# Patient Record
Sex: Female | Born: 1945 | Race: White | Hispanic: No | Marital: Married | Smoking: Never smoker
Health system: Southern US, Community
[De-identification: ages and names within clinical notes are randomized; demographics above are authoritative.]

## PROBLEM LIST (undated history)

## (undated) DIAGNOSIS — H04123 Dry eye syndrome of bilateral lacrimal glands: Secondary | ICD-10-CM

## (undated) DIAGNOSIS — Z9889 Other specified postprocedural states: Secondary | ICD-10-CM

## (undated) DIAGNOSIS — R112 Nausea with vomiting, unspecified: Secondary | ICD-10-CM

## (undated) DIAGNOSIS — H269 Unspecified cataract: Secondary | ICD-10-CM

## (undated) DIAGNOSIS — S52501A Unspecified fracture of the lower end of right radius, initial encounter for closed fracture: Secondary | ICD-10-CM

## (undated) DIAGNOSIS — M810 Age-related osteoporosis without current pathological fracture: Secondary | ICD-10-CM

## (undated) DIAGNOSIS — K219 Gastro-esophageal reflux disease without esophagitis: Secondary | ICD-10-CM

## (undated) DIAGNOSIS — I10 Essential (primary) hypertension: Secondary | ICD-10-CM

## (undated) DIAGNOSIS — M1990 Unspecified osteoarthritis, unspecified site: Secondary | ICD-10-CM

## (undated) DIAGNOSIS — R51 Headache: Secondary | ICD-10-CM

## (undated) DIAGNOSIS — R519 Headache, unspecified: Secondary | ICD-10-CM

## (undated) DIAGNOSIS — Z5189 Encounter for other specified aftercare: Secondary | ICD-10-CM

## (undated) HISTORY — PX: FRACTURE SURGERY: SHX138

## (undated) HISTORY — PX: CATARACT EXTRACTION: SUR2

## (undated) HISTORY — PX: DILATION AND CURETTAGE OF UTERUS: SHX78

## (undated) HISTORY — PX: WRIST FRACTURE SURGERY: SHX121

## (undated) HISTORY — PX: HIP SURGERY: SHX245

## (undated) HISTORY — PX: JOINT REPLACEMENT: SHX530

## (undated) HISTORY — DX: Unspecified cataract: H26.9

## (undated) HISTORY — DX: Encounter for other specified aftercare: Z51.89

---

## 2015-10-28 ENCOUNTER — Emergency Department (HOSPITAL_BASED_OUTPATIENT_CLINIC_OR_DEPARTMENT_OTHER): Payer: Medicare Other

## 2015-10-28 ENCOUNTER — Encounter (HOSPITAL_BASED_OUTPATIENT_CLINIC_OR_DEPARTMENT_OTHER): Payer: Self-pay | Admitting: *Deleted

## 2015-10-28 ENCOUNTER — Emergency Department (HOSPITAL_BASED_OUTPATIENT_CLINIC_OR_DEPARTMENT_OTHER)
Admission: EM | Admit: 2015-10-28 | Discharge: 2015-10-28 | Disposition: A | Discharge status: Home / Self Care | Payer: Medicare Other | Attending: Emergency Medicine | Admitting: Emergency Medicine

## 2015-10-28 ENCOUNTER — Encounter (HOSPITAL_COMMUNITY): Payer: Self-pay | Admitting: *Deleted

## 2015-10-28 ENCOUNTER — Other Ambulatory Visit: Payer: Self-pay | Admitting: Orthopaedic Surgery

## 2015-10-28 DIAGNOSIS — I10 Essential (primary) hypertension: Secondary | ICD-10-CM | POA: Diagnosis not present

## 2015-10-28 DIAGNOSIS — R600 Localized edema: Secondary | ICD-10-CM | POA: Insufficient documentation

## 2015-10-28 DIAGNOSIS — S32592A Other specified fracture of left pubis, initial encounter for closed fracture: Secondary | ICD-10-CM

## 2015-10-28 DIAGNOSIS — Q71891 Other reduction defects of right upper limb: Secondary | ICD-10-CM | POA: Insufficient documentation

## 2015-10-28 DIAGNOSIS — S6991XA Unspecified injury of right wrist, hand and finger(s), initial encounter: Secondary | ICD-10-CM | POA: Diagnosis present

## 2015-10-28 DIAGNOSIS — Z79899 Other long term (current) drug therapy: Secondary | ICD-10-CM | POA: Diagnosis not present

## 2015-10-28 DIAGNOSIS — Y999 Unspecified external cause status: Secondary | ICD-10-CM | POA: Insufficient documentation

## 2015-10-28 DIAGNOSIS — W010XXA Fall on same level from slipping, tripping and stumbling without subsequent striking against object, initial encounter: Secondary | ICD-10-CM | POA: Insufficient documentation

## 2015-10-28 DIAGNOSIS — Y929 Unspecified place or not applicable: Secondary | ICD-10-CM | POA: Diagnosis not present

## 2015-10-28 DIAGNOSIS — Y9301 Activity, walking, marching and hiking: Secondary | ICD-10-CM | POA: Diagnosis not present

## 2015-10-28 DIAGNOSIS — T148XXA Other injury of unspecified body region, initial encounter: Secondary | ICD-10-CM

## 2015-10-28 DIAGNOSIS — S80211A Abrasion, right knee, initial encounter: Secondary | ICD-10-CM | POA: Insufficient documentation

## 2015-10-28 DIAGNOSIS — Q738 Other reduction defects of unspecified limb(s): Secondary | ICD-10-CM

## 2015-10-28 DIAGNOSIS — S62101A Fracture of unspecified carpal bone, right wrist, initial encounter for closed fracture: Secondary | ICD-10-CM

## 2015-10-28 DIAGNOSIS — W19XXXA Unspecified fall, initial encounter: Secondary | ICD-10-CM

## 2015-10-28 HISTORY — DX: Age-related osteoporosis without current pathological fracture: M81.0

## 2015-10-28 HISTORY — DX: Essential (primary) hypertension: I10

## 2015-10-28 LAB — CBC
HEMATOCRIT: 38.4 % (ref 36.0–46.0)
Hemoglobin: 12.9 g/dL (ref 12.0–15.0)
MCH: 29.9 pg (ref 26.0–34.0)
MCHC: 33.6 g/dL (ref 30.0–36.0)
MCV: 88.9 fL (ref 78.0–100.0)
Platelets: 143 10*3/uL — ABNORMAL LOW (ref 150–400)
RBC: 4.32 MIL/uL (ref 3.87–5.11)
RDW: 12.9 % (ref 11.5–15.5)
WBC: 9.2 10*3/uL (ref 4.0–10.5)

## 2015-10-28 LAB — BASIC METABOLIC PANEL
Anion gap: 5 (ref 5–15)
BUN: 12 mg/dL (ref 6–20)
CALCIUM: 8.4 mg/dL — AB (ref 8.9–10.3)
CO2: 26 mmol/L (ref 22–32)
CREATININE: 0.57 mg/dL (ref 0.44–1.00)
Chloride: 107 mmol/L (ref 101–111)
GFR calc Af Amer: >60 mL/min (ref >60)
GFR calc non Af Amer: >60 mL/min (ref >60)
GLUCOSE: 126 mg/dL — AB (ref 65–99)
Potassium: 3.4 mmol/L — ABNORMAL LOW (ref 3.5–5.1)
Sodium: 138 mmol/L (ref 135–145)

## 2015-10-28 LAB — PROTIME-INR
INR: 1.06
Prothrombin Time: 13.8 seconds (ref 11.4–15.2)

## 2015-10-28 MED ORDER — IBUPROFEN 200 MG PO TABS: 600.0000 mg | ORAL_TABLET | Freq: Four times a day (QID) | ORAL | 0 refills | Status: DC | PRN

## 2015-10-28 MED ORDER — PROPOFOL 10 MG/ML IV BOLUS
200.0000 mg | Freq: Once | INTRAVENOUS | Status: DC
  Filled 2015-10-28: qty 20

## 2015-10-28 MED ORDER — OXYCODONE-ACETAMINOPHEN 5-325 MG PO TABS
1.0000 | ORAL_TABLET | Freq: Once | ORAL | Status: AC
  Administered 2015-10-28: 1 via ORAL
  Filled 2015-10-28: qty 1

## 2015-10-28 MED ORDER — FENTANYL CITRATE (PF) 100 MCG/2ML IJ SOLN
INTRAMUSCULAR | Status: AC | PRN
  Administered 2015-10-28: 75 ug via INTRAVENOUS

## 2015-10-28 MED ORDER — PROPOFOL 10 MG/ML IV BOLUS
INTRAVENOUS | Status: AC | PRN
  Administered 2015-10-28: 5 mg via INTRAVENOUS

## 2015-10-28 MED ORDER — PROPOFOL 10 MG/ML IV BOLUS
INTRAVENOUS | Status: AC | PRN
  Administered 2015-10-28: 10 mg via INTRAVENOUS

## 2015-10-28 MED ORDER — ACETAMINOPHEN 500 MG PO TABS: 1000.0000 mg | ORAL_TABLET | Freq: Three times a day (TID) | ORAL | 0 refills | Status: AC

## 2015-10-28 MED ORDER — OXYCODONE-ACETAMINOPHEN 5-325 MG PO TABS: 1.0000 | ORAL_TABLET | Freq: Three times a day (TID) | ORAL | 0 refills | Status: DC | PRN

## 2015-10-28 MED ORDER — NALOXONE HCL 2 MG/2ML IJ SOSY
PREFILLED_SYRINGE | INTRAMUSCULAR | Status: AC
  Filled 2015-10-28: qty 2

## 2015-10-28 MED ORDER — FENTANYL CITRATE (PF) 100 MCG/2ML IJ SOLN
50.0000 ug | Freq: Once | INTRAMUSCULAR | Status: AC
  Administered 2015-10-28: 50 ug via INTRAVENOUS
  Filled 2015-10-28: qty 2

## 2015-10-28 MED ORDER — FENTANYL CITRATE (PF) 100 MCG/2ML IJ SOLN
INTRAMUSCULAR | Status: AC
  Filled 2015-10-28: qty 2

## 2015-10-28 MED FILL — OXYCODONE/APAP 5-325: 5-325 | 5 days supply | Qty: 15 | Fill #0

## 2015-10-28 MED FILL — MAPAP 500 MG CAPLET: 500 | 17 days supply | Qty: 100 | Fill #0

## 2015-10-28 NOTE — ED Notes (Signed)
Pt called out to use restroom, was able to stand and pivot to bedside toilet. Was also able to stand and pivot to wheelchair to exit dept. VSS upon departure.

## 2015-10-28 NOTE — ED Notes (Signed)
Spoke to Dr. Eudelia Bunch about pt status. States to encourage PO fluids and watch pt for improvement. Pt provided with drink and on automatic cycling vitals.

## 2015-10-28 NOTE — ED Notes (Signed)
md at bedside to update pt on plan of care. Pt no longer npo, will follow up in office tomorrow. Pt given sprite, husband given coffee per request.

## 2015-10-28 NOTE — Progress Notes (Signed)
Pt denies SOB, chest pain, and being under the care of a cardiologist. Pt denies having a stress test, echo and cardiac cath. Pt denies having a chest x ray within the last year. Pt made aware to stop taking Aspirin, fish oil, vitamins and herbal medications. Do not take any NSAIDs ie: Ibuprofen, Advil, Naproxen, BC and Goody Powder or any medication containing Aspirin. Rica Mast, NP, Anesthesia, reviewed pt EKG; no new orders given.

## 2015-10-28 NOTE — ED Provider Notes (Signed)
[facility] Note   CSN: [id number] Arrival date & time: 10/28/15  2952  First Provider Contact:  First MD Initiated Contact with Patient 10/28/15 [phone]        History   Chief Complaint Chief Complaint  Patient presents with  . Fall    HPI Renee Riley is a 70 y.o. female.  The history is provided by the patient.  Fall  This is a new problem. The current episode started 1 to 2 hours ago (while waling the dog this am). The problem occurs constantly. The problem has not changed since onset.Pertinent negatives include no chest pain, no abdominal pain, no headaches and no shortness of breath. The symptoms are aggravated by walking (movement). The symptoms are relieved by rest. She has tried nothing for the symptoms. The treatment provided mild relief.   She also reports previous left hip fracture with internal fixation in place at this time. She reports that the left hip is sore however not tender to palpation. Patient does have a history of osteoporosis  Past Medical History:  Diagnosis Date  . Hypertension   . Osteoporosis     There are no active problems to display for this patient.   History reviewed. No pertinent surgical history.  OB History    No data available       Home Medications    Prior to Admission medications   Medication Sig Start Date End Date Taking? Authorizing Provider  amlodipine-atorvastatin (CADUET) 10-10 MG tablet Take 1 tablet by mouth daily.   Yes Historical Provider, MD  acetaminophen (TYLENOL) 500 MG tablet Take 2 tablets (1,000 mg total) by mouth every 8 (eight) hours. Please do not exceed 4000 mg of acetaminophen in a 24-hour period. Please be advised that the other prescription medicine also contains acetaminophen. 10/28/15 10/30/15  Nira Conn, MD  ibuprofen (ADVIL) 200 MG tablet Take 3 tablets (600 mg total) by mouth every 6 (six) hours as needed for moderate pain. 10/28/15   Nira Conn, MD    oxyCODONE-acetaminophen (PERCOCET/ROXICET) 5-325 MG tablet Take 1 tablet by mouth every 8 (eight) hours as needed for severe pain (If pain is not controlled with Tylenol. Do not exceed 4000 mg of acetaminophen in a 24-hour period this medicine also contains acetaminophen so care should be taken.). 10/28/15   Nira Conn, MD    Family History History reviewed. No pertinent family history.  Social History Social History  Substance Use Topics  . Smoking status: Never Smoker  . Smokeless tobacco: Never Used  . Alcohol use Not on file     Allergies   Review of patient's allergies indicates no known allergies.   Review of Systems Review of Systems  Constitutional: Negative for fatigue.  HENT: Negative for facial swelling and voice change.   Eyes: Negative for pain and visual disturbance.  Respiratory: Negative for cough and shortness of breath.   Cardiovascular: Negative for chest pain and palpitations.  Gastrointestinal: Negative for abdominal pain and vomiting.  Genitourinary: Negative for difficulty urinating.  Musculoskeletal: Positive for gait problem and joint swelling. Negative for arthralgias, back pain and neck pain.  Skin: Positive for wound.  Neurological: Negative for seizures, syncope and headaches.  All other systems reviewed and are negative.    Physical Exam Updated Vital Signs BP 108/79   Pulse 73   Temp 97.9 F (36.6 C) (Oral)   Resp 16   Ht  (1.676 m)   Wt 130 lb (59 kg)  SpO2 98%   BMI 20.98 kg/m   Physical Exam  Constitutional: She is oriented to person, place, and time. She appears well-developed and well-nourished. No distress.  HENT:  Head: Normocephalic and atraumatic.  Right Ear: External ear normal.  Left Ear: External ear normal.  Nose: Nose normal.  Eyes: Conjunctivae and EOM are normal. Pupils are equal, round, and reactive to light. Right eye exhibits no discharge. Left eye exhibits no discharge. No scleral icterus.   Neck: Normal range of motion. Neck supple.  Cardiovascular: Normal rate, regular rhythm and normal heart sounds.  Exam reveals no gallop and no friction rub.   No murmur heard. Pulses:      Radial pulses are 2+ on the right side, and 2+ on the left side.       Dorsalis pedis pulses are 2+ on the right side, and 2+ on the left side.  Pulmonary/Chest: Effort normal and breath sounds normal. No stridor. No respiratory distress. She has no wheezes.  Abdominal: Soft. She exhibits no distension. There is no tenderness.  Musculoskeletal: She exhibits no edema or tenderness.       Right wrist: She exhibits bony tenderness, swelling and deformity.       Left hip: She exhibits normal range of motion (however painful), no bony tenderness, no deformity and no laceration.       Right knee: She exhibits no swelling, no effusion, no ecchymosis and no deformity.       Cervical back: She exhibits no tenderness and no bony tenderness.       Thoracic back: She exhibits no bony tenderness.       Lumbar back: She exhibits no bony tenderness.  Clavicles stable. Chest stable to AP/Lat compression Pelvis stable to Lat compression    Neurological: She is alert and oriented to person, place, and time.  Moving all extremities  Skin: Skin is warm and dry. Abrasion (right knee) noted. No rash noted. She is not diaphoretic. No erythema.  Psychiatric: She has a normal mood and affect.  Nursing note and vitals reviewed.    ED Treatments / Results  Labs (all labs ordered are listed, but only abnormal results are displayed) Labs Reviewed  CBC - Abnormal; Notable for the following:       Result Value   Platelets 143 (*)    All other components within normal limits  BASIC METABOLIC PANEL - Abnormal; Notable for the following:    Potassium 3.4 (*)    Glucose, Bld 126 (*)    Calcium 8.4 (*)    All other components within normal limits  PROTIME-INR    EKG  EKG Interpretation  Date/Time:  Tuesday October 28 2015 10:18:37 EDT Ventricular Rate:  71 PR Interval:    QRS Duration: 89 QT Interval:  410 QTC Calculation: 446 R Axis:   -7 Text Interpretation:  Sinus rhythm Low voltage, precordial leads Nonspecific T wave abnormality No old tracing to compare Confirmed by [facility] MD, PEDRO ([id number]) on 10/28/2015 10:50:58 AM       Radiology Dg Lumbar Spine 2-3 Views  Result Date: 10/28/2015 CLINICAL DATA:  Fall.  Back pain EXAM: LUMBAR SPINE - 2-3 VIEW COMPARISON:  None. FINDINGS: S1 is partially lumbarized. Slight anterior slip L3-4 with associated disc degeneration and spurring right greater than left. Marked disc space narrowing and spurring L5-S1. The facet degeneration L5-S1 Negative for fracture. Right hip replacement. Pinning of left hip. Fracture of the left superior pubis appears acute IMPRESSION: Negative for lumbar  spine fracture Fracture left superior pubis appears acute. Electronically Signed   By: Marlan Palau M.D.   On: 10/28/2015 08:27   Dg Wrist 2 Views Right  Result Date: 10/28/2015 CLINICAL DATA:  Status post reduction EXAM: RIGHT WRIST - 2 VIEW COMPARISON:  Films from earlier in the same day FINDINGS: Previously seen distal radial fracture has been reduced somewhat. The degree of angulation has improved although persistent impaction of the fracture fragments is noted. IMPRESSION: Improved angulation following reduction and casting. Electronically Signed   By: Alcide Clever M.D.   On: 10/28/2015 09:46   Dg Wrist Complete Right  Result Date: 10/28/2015 CLINICAL DATA:  Pt c/o right wrist pain and tenderness, right knee pain and tenderness-right knee laceration to lateral aspect, left hip pain and tenderness, left sided lower back pain all since falling this morning after getting a leash tangled around her ankles EXAM: RIGHT WRIST - COMPLETE 3+ VIEW COMPARISON:  None. FINDINGS: There is a comminuted fracture of the distal right radius, extending to the articular surface and affecting radial ulnar  joint. Ulnar plus variance noted. There is dorsal tilting dorsal displacement by 1/2 shaft width. Intercarpal alignment appears normal. Distal ulna is intact. Degenerative changes are identified at the first carpometacarpal joint. IMPRESSION: Comminuted fracture of the distal radius affecting the radiocarpal joint and radioulnar joint. Ulnar plus variance. Electronically Signed   By: Norva Pavlov M.D.   On: 10/28/2015 08:26   Dg Knee Complete 4 Views Right  Result Date: 10/28/2015 CLINICAL DATA:  Pain and tenderness following fall. Laceration laterally EXAM: RIGHT KNEE - COMPLETE 4+ VIEW COMPARISON:  None. FINDINGS: Frontal, lateral, and bilateral oblique views were obtained. There is no apparent fracture or dislocation. No joint effusion. No soft tissue air evident. There is joint space narrowing medially and in the patellofemoral joint regions. No erosive change. IMPRESSION: Areas of osteoarthritic change. No fracture or dislocation. No joint effusion or soft tissue air. Electronically Signed   By: Bretta Bang III M.D.   On: 10/28/2015 08:25   Dg Hip Unilat With Pelvis Min 4 Views Left  Result Date: 10/28/2015 CLINICAL DATA:  Fall after a leash got tangled around the patient's ankles. Left hip pain and tenderness. Left-sided low back pain. Initial encounter. EXAM: DG HIP (WITH OR WITHOUT PELVIS) 4+V LEFT COMPARISON:  None. FINDINGS: There is evidence of an old proximal left femur fracture with 3 cannulated lag screws terminating in the femoral head. The left hip is located without an acute fracture identified. A small ossicle adjacent to the superolateral left acetabulum may be related to remote trauma or be developmental. Limited evaluation of a right total hip arthroplasty is unremarkable. IMPRESSION: Posttraumatic and postoperative changes without evidence of acute osseous abnormality. Electronically Signed   By: Sebastian Ache M.D.   On: 10/28/2015 08:27    Procedures Procedures (including  critical care time)  Procedural sedation Performed by: Amadeo Garnet Cardama Consent: Verbal consent obtained. Risks and benefits: risks, benefits and alternatives were discussed Required items: required blood products, implants, devices, and/or special equipment available Patient identity confirmed: arm band and provided demographic data Time out: Immediately prior to procedure a "time out" was called to verify the correct patient, procedure, equipment, support staff and site/side marked as required.  Sedation type: moderate (conscious) sedation NPO time confirmed and considedered  Sedatives: propofol (50mg  )total  Physician Time at Bedside: 0830  Vitals: Vital signs were monitored during sedation. Cardiac Monitor, pulse oximeter Patient tolerance: Patient tolerated the procedure  well with no immediate complications. Comments: Pt with uneventful recovered. Returned to pre-procedural sedation baseline  Reduction of dislocation Performed by: Amadeo Garnet Cardama Consent: Verbal consent obtained. Risks and benefits: risks, benefits and alternatives were discussed Consent given by: patient Required items: required blood products, implants, devices, and special equipment available Time out: Immediately prior to procedure a "time out" was called to verify the correct patient, procedure, equipment, support staff and site/side marked as required.  Patient sedated: yes, as above  Vitals: Vital signs were monitored during sedation. Patient tolerance: Patient tolerated the procedure well with no immediate complications. Joint: right wrist Reduction technique: manipulation   Medications Ordered in ED Medications  fentaNYL (SUBLIMAZE) injection 50 mcg (50 mcg Intravenous Given 10/28/15 0759)  propofol (DIPRIVAN) 10 mg/mL bolus/IV push ( Intravenous Stopped 10/28/15 1153)  propofol (DIPRIVAN) 10 mg/mL bolus/IV push ( Intravenous Stopped 10/28/15 1153)  propofol (DIPRIVAN) 10 mg/mL bolus/IV  push ( Intravenous Stopped 10/28/15 1154)  naloxone (NARCAN) 2 MG/2ML injection (  Return to [facility] 10/28/15 1154)  propofol (DIPRIVAN) 10 mg/mL bolus/IV push ( Intravenous Stopped 10/28/15 1153)  propofol (DIPRIVAN) 10 mg/mL bolus/IV push ( Intravenous Stopped 10/28/15 1153)  propofol (DIPRIVAN) 10 mg/mL bolus/IV push ( Intravenous Stopped 10/28/15 1153)  fentaNYL (SUBLIMAZE) injection (75 mcg Intravenous Given 10/28/15 0900)  oxyCODONE-acetaminophen (PERCOCET/ROXICET) 5-325 MG per tablet 1 tablet (1 tablet Oral Given 10/28/15 1018)     Initial Impression / Assessment and Plan / ED Course  I have reviewed the triage vital signs and the nursing notes.  Pertinent labs & imaging results that were available during my care of the patient were reviewed by me and considered in my medical decision making (see chart for details).  Clinical Course    Obvious right wrist deformity that is neurovascularly intact. Will obtain plain film to better characterize the fracture. Will also obtain plain films of the lower back left hip and right knee to assess for any other injuries.   Patient provided with fentanyl for pain. Discussed procedural sedation with the patient For reduction of her right distal radius fracture. Patient agreed with the plan.   Plain film of the right wrist with Colle's as fracture with lateral ulnar joint displacement. Exam of the hip also notable for likely acute superior ramus fracture. We'll consult orthopedic surgery.  Procedural sedation and reduction as above. Sugar tong splint placed.  Patient will need to follow-up with orthopedic surgery tomorrow For repair of the right distal radius.. She was provided with Dr. Warren Danes office number in order to call in case she was not contacted later on today for surgical planning.  Patient safe for discharge with strict return precautions. Patient follow-up with orthopedic surgery as above.    Final Clinical Impressions(s) / ED Diagnoses   Final  diagnoses:  Reduction defect of extremity  Fall, initial encounter  Wrist fracture, right, closed, initial encounter  Inferior pubic ramus fracture, left, closed, initial encounter (HCC)  Abrasion    New Prescriptions Discharge Medication List as of 10/28/2015 10:56 AM    START taking these medications   Details  acetaminophen (TYLENOL) 500 MG tablet Take 2 tablets (1,000 mg total) by mouth every 8 (eight) hours. Please do not exceed 4000 mg of acetaminophen in a 24-hour period. Please be advised that the other prescription medicine also contains acetaminophen., Starting Tue 10/28/2015, Until Thu 8/3/2 017, OTC    ibuprofen (ADVIL) 200 MG tablet Take 3 tablets (600 mg total) by mouth every 6 (six) hours as needed  for moderate pain., Starting Tue 10/28/2015, Print    oxyCODONE-acetaminophen (PERCOCET/ROXICET) 5-325 MG tablet Take 1 tablet by mouth every 8 (eight) hours as needed for severe pain (If pain is not controlled with Tylenol. Do not exceed 4000 mg of acetaminophen in a 24-hour period this medicine also contains acetaminophen so care should be taken.)., Starting Tue  10/28/2015, Print         Nira Conn, MD 10/28/15 [phone]

## 2015-10-28 NOTE — Progress Notes (Signed)
Pt verbalized understanding of all pre-op instructions. 

## 2015-10-28 NOTE — Discharge Instructions (Signed)
The orthopedic surgeon recommended weightbearing as tolerated for your pelvic fracture which is nonoperable at this time. They will see you tomorrow for surgery of the wrist and can address any questions at that time as well too.

## 2015-10-28 NOTE — ED Triage Notes (Signed)
Pt reports walking her dogs this morning and tripped and fell. Pt reports right wrist pain, deformity noted, wrist splinted per protocol. Pt reports left hip and back pain, reports history of hip fx and osteoprosis. Denies head injury or loc

## 2015-10-28 NOTE — ED Notes (Signed)
Pt stated she felt like she may pass out when standing to get to wheelchair to leave. Upon assessment, pt BP was 86 systolic sitting on edge of the bed. Pt is alert, interactive, and in NAD but does not feel well. Instructed to stay in the room on the bed until she feels better.

## 2015-10-28 NOTE — ED Notes (Signed)
Pt placed on 2lpm [place] with capnography.

## 2015-10-29 ENCOUNTER — Encounter (HOSPITAL_COMMUNITY)
Admission: RE | Disposition: A | Discharge status: Home / Self Care | Payer: Self-pay | Source: Ambulatory Visit | Attending: Orthopaedic Surgery

## 2015-10-29 ENCOUNTER — Ambulatory Visit (HOSPITAL_COMMUNITY): Payer: Medicare Other | Admitting: Anesthesiology

## 2015-10-29 ENCOUNTER — Encounter (HOSPITAL_COMMUNITY): Payer: Self-pay | Admitting: *Deleted

## 2015-10-29 ENCOUNTER — Ambulatory Visit (HOSPITAL_COMMUNITY): Payer: Medicare Other

## 2015-10-29 ENCOUNTER — Ambulatory Visit (HOSPITAL_COMMUNITY)
Admission: RE | Admit: 2015-10-29 | Discharge: 2015-10-29 | Disposition: A | Discharge status: Home / Self Care | Payer: Medicare Other | Source: Ambulatory Visit | Attending: Orthopaedic Surgery | Admitting: Orthopaedic Surgery

## 2015-10-29 DIAGNOSIS — X58XXXA Exposure to other specified factors, initial encounter: Secondary | ICD-10-CM | POA: Insufficient documentation

## 2015-10-29 DIAGNOSIS — I10 Essential (primary) hypertension: Secondary | ICD-10-CM | POA: Insufficient documentation

## 2015-10-29 DIAGNOSIS — Z7982 Long term (current) use of aspirin: Secondary | ICD-10-CM | POA: Insufficient documentation

## 2015-10-29 DIAGNOSIS — S52501A Unspecified fracture of the lower end of right radius, initial encounter for closed fracture: Secondary | ICD-10-CM | POA: Diagnosis present

## 2015-10-29 DIAGNOSIS — K219 Gastro-esophageal reflux disease without esophagitis: Secondary | ICD-10-CM | POA: Insufficient documentation

## 2015-10-29 DIAGNOSIS — Z419 Encounter for procedure for purposes other than remedying health state, unspecified: Secondary | ICD-10-CM

## 2015-10-29 HISTORY — DX: Nausea with vomiting, unspecified: R11.2

## 2015-10-29 HISTORY — PX: OPEN REDUCTION INTERNAL FIXATION (ORIF) DISTAL RADIAL FRACTURE: SHX5989

## 2015-10-29 HISTORY — DX: Headache: R51

## 2015-10-29 HISTORY — DX: Dry eye syndrome of bilateral lacrimal glands: H04.123

## 2015-10-29 HISTORY — DX: Headache, unspecified: R51.9

## 2015-10-29 HISTORY — DX: Other specified postprocedural states: Z98.890

## 2015-10-29 HISTORY — DX: Gastro-esophageal reflux disease without esophagitis: K21.9

## 2015-10-29 HISTORY — DX: Unspecified osteoarthritis, unspecified site: M19.90

## 2015-10-29 HISTORY — DX: Unspecified fracture of the lower end of right radius, initial encounter for closed fracture: S52.501A

## 2015-10-29 SURGERY — OPEN REDUCTION INTERNAL FIXATION (ORIF) DISTAL RADIUS FRACTURE
Anesthesia: General | Site: Wrist | Laterality: Right

## 2015-10-29 MED ORDER — FENTANYL CITRATE (PF) 100 MCG/2ML IJ SOLN
INTRAMUSCULAR | Status: AC
  Administered 2015-10-29: 100 ug via INTRAVENOUS
  Filled 2015-10-29: qty 2

## 2015-10-29 MED ORDER — FENTANYL CITRATE (PF) 100 MCG/2ML IJ SOLN
50.0000 ug | INTRAMUSCULAR | Status: DC | PRN
  Administered 2015-10-29: 100 ug via INTRAVENOUS

## 2015-10-29 MED ORDER — MIDAZOLAM HCL 5 MG/5ML IJ SOLN
INTRAMUSCULAR | Status: DC | PRN
  Administered 2015-10-29: 2 mg via INTRAVENOUS

## 2015-10-29 MED ORDER — LACTATED RINGERS IV SOLN
INTRAVENOUS | Status: DC
  Administered 2015-10-29 (×3): via INTRAVENOUS

## 2015-10-29 MED ORDER — MIDAZOLAM HCL 2 MG/2ML IJ SOLN
1.0000 mg | INTRAMUSCULAR | Status: DC | PRN
  Administered 2015-10-29: 2 mg via INTRAVENOUS

## 2015-10-29 MED ORDER — MIDAZOLAM HCL 2 MG/2ML IJ SOLN: 0.5000 mg | Freq: Once | INTRAMUSCULAR | Status: DC | PRN

## 2015-10-29 MED ORDER — MEPERIDINE HCL 25 MG/ML IJ SOLN
INTRAMUSCULAR | Status: AC
  Administered 2015-10-29: 6.25 mg via INTRAVENOUS
  Filled 2015-10-29: qty 1

## 2015-10-29 MED ORDER — HYDROMORPHONE HCL 1 MG/ML IJ SOLN
0.2500 mg | INTRAMUSCULAR | Status: DC | PRN
  Administered 2015-10-29: 0.5 mg via INTRAVENOUS

## 2015-10-29 MED ORDER — PROMETHAZINE HCL 25 MG/ML IJ SOLN: 6.2500 mg | INTRAMUSCULAR | Status: DC | PRN

## 2015-10-29 MED ORDER — PROPOFOL 10 MG/ML IV BOLUS
INTRAVENOUS | Status: AC
  Filled 2015-10-29: qty 40

## 2015-10-29 MED ORDER — SENNOSIDES-DOCUSATE SODIUM 8.6-50 MG PO TABS: 1.0000 | ORAL_TABLET | Freq: Every evening | ORAL | 1 refills | Status: DC | PRN

## 2015-10-29 MED ORDER — LIDOCAINE HCL (CARDIAC) 20 MG/ML IV SOLN
INTRAVENOUS | Status: DC | PRN
  Administered 2015-10-29: 20 mg via INTRAVENOUS

## 2015-10-29 MED ORDER — PROPOFOL 10 MG/ML IV BOLUS
INTRAVENOUS | Status: DC | PRN
  Administered 2015-10-29: 150 mg via INTRAVENOUS

## 2015-10-29 MED ORDER — ONDANSETRON HCL 4 MG/2ML IJ SOLN
INTRAMUSCULAR | Status: DC | PRN
  Administered 2015-10-29: 4 mg via INTRAVENOUS

## 2015-10-29 MED ORDER — MIDAZOLAM HCL 2 MG/2ML IJ SOLN
INTRAMUSCULAR | Status: AC
  Filled 2015-10-29: qty 2

## 2015-10-29 MED ORDER — MIDAZOLAM HCL 2 MG/2ML IJ SOLN
INTRAMUSCULAR | Status: AC
  Administered 2015-10-29: 2 mg via INTRAVENOUS
  Filled 2015-10-29: qty 2

## 2015-10-29 MED ORDER — HYDROMORPHONE HCL 1 MG/ML IJ SOLN
INTRAMUSCULAR | Status: AC
  Administered 2015-10-29: 0.5 mg via INTRAVENOUS
  Filled 2015-10-29: qty 1

## 2015-10-29 MED ORDER — CEFAZOLIN SODIUM-DEXTROSE 2-4 GM/100ML-% IV SOLN
2.0000 g | INTRAVENOUS | Status: AC
Start: 2015-10-29 — End: 2015-10-29
  Administered 2015-10-29: 2 g via INTRAVENOUS
  Filled 2015-10-29: qty 100

## 2015-10-29 MED ORDER — VITAMIN C 500 MG PO CHEW: 500.0000 mg | CHEWABLE_TABLET | Freq: Two times a day (BID) | ORAL | 0 refills | Status: AC

## 2015-10-29 MED ORDER — FENTANYL CITRATE (PF) 100 MCG/2ML IJ SOLN
INTRAMUSCULAR | Status: DC | PRN
  Administered 2015-10-29: 50 ug via INTRAVENOUS

## 2015-10-29 MED ORDER — OXYCODONE HCL ER 10 MG PO T12A: 10.0000 mg | EXTENDED_RELEASE_TABLET | Freq: Two times a day (BID) | ORAL | 0 refills | Status: DC

## 2015-10-29 MED ORDER — MEPERIDINE HCL 25 MG/ML IJ SOLN
6.2500 mg | INTRAMUSCULAR | Status: DC | PRN
  Administered 2015-10-29: 6.25 mg via INTRAVENOUS

## 2015-10-29 MED ORDER — ONDANSETRON HCL 4 MG PO TABS: 4.0000 mg | ORAL_TABLET | Freq: Three times a day (TID) | ORAL | 0 refills | Status: DC | PRN

## 2015-10-29 MED ORDER — 0.9 % SODIUM CHLORIDE (POUR BTL) OPTIME
TOPICAL | Status: DC | PRN
  Administered 2015-10-29: 1000 mL

## 2015-10-29 MED ORDER — FENTANYL CITRATE (PF) 250 MCG/5ML IJ SOLN
INTRAMUSCULAR | Status: AC
  Filled 2015-10-29: qty 5

## 2015-10-29 MED ORDER — OXYCODONE HCL 5 MG PO TABS: 5.0000 mg | ORAL_TABLET | ORAL | 0 refills | Status: DC | PRN

## 2015-10-29 SURGICAL SUPPLY — 62 items
BANDAGE ELASTIC 3 VELCRO ST LF (GAUZE/BANDAGES/DRESSINGS) ×2 IMPLANT
BANDAGE ELASTIC 4 VELCRO ST LF (GAUZE/BANDAGES/DRESSINGS) ×2 IMPLANT
BIT DRILL 2.2 SS TIBIAL (BIT) ×2 IMPLANT
BLADE SURG 10 STRL SS (BLADE) ×2 IMPLANT
BLADE SURG 15 STRL LF DISP TIS (BLADE) ×1 IMPLANT
BLADE SURG 15 STRL SS (BLADE) ×1
BLADE SURG ROTATE 9660 (MISCELLANEOUS) IMPLANT
BNDG ESMARK 4X9 LF (GAUZE/BANDAGES/DRESSINGS) ×2 IMPLANT
BNDG GAUZE ELAST 4 BULKY (GAUZE/BANDAGES/DRESSINGS) ×2 IMPLANT
CORDS BIPOLAR (ELECTRODE) ×2 IMPLANT
COVER SURGICAL LIGHT HANDLE (MISCELLANEOUS) ×2 IMPLANT
CUFF TOURNIQUET SINGLE 18IN (TOURNIQUET CUFF) ×2 IMPLANT
CUFF TOURNIQUET SINGLE 24IN (TOURNIQUET CUFF) IMPLANT
DRAPE C-ARM 42X72 X-RAY (DRAPES) IMPLANT
DRAPE U-SHAPE 47X51 STRL (DRAPES) ×2 IMPLANT
ELECT CAUTERY BLADE 6.4 (BLADE) ×2 IMPLANT
GAUZE SPONGE 4X4 12PLY STRL (GAUZE/BANDAGES/DRESSINGS) ×2 IMPLANT
GAUZE XEROFORM 1X8 LF (GAUZE/BANDAGES/DRESSINGS) ×2 IMPLANT
GLOVE SKINSENSE NS SZ7.5 (GLOVE) ×1
GLOVE SKINSENSE STRL SZ7.5 (GLOVE) ×1 IMPLANT
GOWN STRL REIN XL XLG (GOWN DISPOSABLE) ×2 IMPLANT
K-WIRE 1.6 (WIRE) ×3
K-WIRE FX5X1.6XNS BN SS (WIRE) ×3
KIT BASIN OR (CUSTOM PROCEDURE TRAY) ×2 IMPLANT
KIT ROOM TURNOVER OR (KITS) ×2 IMPLANT
KWIRE FX5X1.6XNS BN SS (WIRE) ×3 IMPLANT
NEEDLE 22X1 1/2 (OR ONLY) (NEEDLE) IMPLANT
NS IRRIG 1000ML POUR BTL (IV SOLUTION) ×2 IMPLANT
PACK ORTHO EXTREMITY (CUSTOM PROCEDURE TRAY) ×2 IMPLANT
PAD ARMBOARD 7.5X6 YLW CONV (MISCELLANEOUS) ×4 IMPLANT
PAD CAST 4YDX4 CTTN HI CHSV (CAST SUPPLIES) ×1 IMPLANT
PADDING CAST COTTON 4X4 STRL (CAST SUPPLIES) ×1
PLATE DVR CROSSLOCK STD RT (Plate) ×2 IMPLANT
PUTTY DBM STAGRAFT PLUS 2CC (Putty) ×2 IMPLANT
SCREW LOCK 12X2.7X 3 LD (Screw) ×2 IMPLANT
SCREW LOCK 14X2.7X 3 LD TPR (Screw) ×1 IMPLANT
SCREW LOCK 16X2.7X 3 LD TPR (Screw) ×1 IMPLANT
SCREW LOCK 18X2.7X 3 LD TPR (Screw) ×3 IMPLANT
SCREW LOCK 20X2.7X 3 LD TPR (Screw) ×2 IMPLANT
SCREW LOCK 22X2.7X 3 LD TPR (Screw) ×1 IMPLANT
SCREW LOCKING 2.7X12MM (Screw) ×2 IMPLANT
SCREW LOCKING 2.7X13MM (Screw) ×2 IMPLANT
SCREW LOCKING 2.7X14 (Screw) ×1 IMPLANT
SCREW LOCKING 2.7X16 (Screw) ×1 IMPLANT
SCREW LOCKING 2.7X18 (Screw) ×3 IMPLANT
SCREW LOCKING 2.7X20MM (Screw) ×2 IMPLANT
SCREW LOCKING 2.7X22MM (Screw) ×1 IMPLANT
SCREW NLOCK 24X2.7 3 LD (Screw) ×1 IMPLANT
SCREW NONLOCK 2.7X24 (Screw) ×1 IMPLANT
SPLINT PLASTER EXTRA FAST 3X15 (CAST SUPPLIES) ×2
SPLINT PLASTER GYPS XFAST 3X15 (CAST SUPPLIES) ×2 IMPLANT
SPONGE LAP 4X18 X RAY DECT (DISPOSABLE) IMPLANT
SUT ETHILON 2 0 FS 18 (SUTURE) ×4 IMPLANT
SUT ETHILON 4 0 PS 2 18 (SUTURE) ×2 IMPLANT
SUT MNCRL AB 4-0 PS2 18 (SUTURE) IMPLANT
SUT PROLENE 3 0 PS 2 (SUTURE) IMPLANT
SUT VIC AB 3-0 FS2 27 (SUTURE) IMPLANT
SYR CONTROL 10ML LL (SYRINGE) IMPLANT
TOWEL OR 17X24 6PK STRL BLUE (TOWEL DISPOSABLE) ×2 IMPLANT
TOWEL OR 17X26 10 PK STRL BLUE (TOWEL DISPOSABLE) ×2 IMPLANT
TUBE CONNECTING 12X1/4 (SUCTIONS) ×2 IMPLANT
UNDERPAD 30X30 INCONTINENT (UNDERPADS AND DIAPERS) ×2 IMPLANT

## 2015-10-29 NOTE — Transfer of Care (Addendum)
Immediate Anesthesia Transfer of Care Note  Patient: Renee Riley  Procedure(s) Performed: Procedure(s): OPEN REDUCTION INTERNAL FIXATION (ORIF) RIGHT DISTAL RADIUS FRACTURE (Right)  Patient Location: PACU  Anesthesia Type:General and Regional  Level of Consciousness: awake, alert , oriented and patient cooperative  Airway & Oxygen Therapy: Patient Spontanous Breathing and Patient connected to face mask oxygen  Post-op Assessment: Report given to RN and Post -op Vital signs reviewed and stable  Post vital signs: Reviewed and stable  Last Vitals:  Vitals:   10/29/15 1649 10/29/15 1715  BP: (!) 152/75   Pulse: 81 68  Resp: 20 12  Temp:  36.7 C    Last Pain:  Vitals:   10/29/15 1715  TempSrc:   PainSc: 0-No pain      Patients Stated Pain Goal: 3 (10/29/15 1219)  Complications: No apparent anesthesia complications

## 2015-10-29 NOTE — Anesthesia Procedure Notes (Signed)
Anesthesia Regional Block:  Supraclavicular block  Pre-Anesthetic Checklist: ,, timeout performed, Correct Patient, Correct Site, Correct Laterality, Correct Procedure, Correct Position, site marked,,, at surgeon's request  Laterality: Right  Prep: chloraprep       Needles:      Needle Length: 4cm 4 cm Needle Gauge: 21 and 21 G    Additional Needles:  Procedures: ultrasound guided (picture in chart) and nerve stimulator Supraclavicular block  Nerve Stimulator or Paresthesia:  Response: arm, 0.35 mA,   Additional Responses:   Narrative:  Injection made incrementally with aspirations every 5 mL. Anesthesiologist: Cristela Blue  Additional Notes: 20cc .5% Naropin Tolerated well

## 2015-10-29 NOTE — Anesthesia Preprocedure Evaluation (Signed)
Anesthesia Evaluation  Patient identified by MRN, date of birth, ID band Patient awake    Reviewed: Allergy & Precautions, H&P , Patient's Chart, lab work & pertinent test results, reviewed documented beta blocker date and time   Airway Mallampati: II  TM Distance: >3 FB Neck ROM: full    Dental no notable dental hx.    Pulmonary    Pulmonary exam normal breath sounds clear to auscultation       Cardiovascular hypertension, On Medications  Rhythm:regular Rate:Normal     Neuro/Psych    GI/Hepatic GERD  ,  Endo/Other    Renal/GU      Musculoskeletal   Abdominal   Peds  Hematology   Anesthesia Other Findings   Reproductive/Obstetrics                             Anesthesia Physical Anesthesia Plan  ASA: II  Anesthesia Plan:    Post-op Pain Management: GA combined w/ Regional for post-op pain   Induction: Intravenous  Airway Management Planned: LMA  Additional Equipment:   Intra-op Plan:   Post-operative Plan:   Informed Consent: I have reviewed the patients History and Physical, chart, labs and discussed the procedure including the risks, benefits and alternatives for the proposed anesthesia with the patient or authorized representative who has indicated his/her understanding and acceptance.   Dental Advisory Given and Dental advisory given  Plan Discussed with: CRNA and Surgeon  Anesthesia Plan Comments: (Discussed GA with LMA, possible sore throat, potential need to switch to ETT, N/V, pulmonary aspiration. Questions answered. )        Anesthesia Quick Evaluation

## 2015-10-29 NOTE — H&P (Addendum)
PREOPERATIVE H&P  Chief Complaint: right distal radius fracture  HPI: Renee Riley is a 70 y.o. female who presents for surgical treatment of right distal radius fracture.  She denies any changes in medical history.  Past Medical History:  Diagnosis Date  . Arthritis   . Distal radius fracture, right   . Dry eyes   . GERD (gastroesophageal reflux disease)   . Headache   . Hypertension   . Osteoporosis   . PONV (postoperative nausea and vomiting)    Past Surgical History:  Procedure Laterality Date  . CATARACT EXTRACTION     right eye  . DILATION AND CURETTAGE OF UTERUS    . FRACTURE SURGERY     Left hip   . JOINT REPLACEMENT     right hip   Social History   Social History  . Marital status: Married    Spouse name: N/A  . Number of children: N/A  . Years of education: N/A   Social History Main Topics  . Smoking status: Never Smoker  . Smokeless tobacco: Never Used  . Alcohol use Yes     Comment: occasional  . Drug use: No  . Sexual activity: Not Asked   Other Topics Concern  . None   Social History Narrative  . None   Family History  Problem Relation Age of Onset  . Kidney Stones Mother   . Osteoporosis Mother   . Cancer Father    No Known Allergies Prior to Admission medications   Medication Sig Start Date End Date Taking? Authorizing Provider  acetaminophen (TYLENOL) 500 MG tablet Take 2 tablets (1,000 mg total) by mouth every 8 (eight) hours. Please do not exceed 4000 mg of acetaminophen in a 24-hour period. Please be advised that the other prescription medicine also contains acetaminophen. 10/28/15 10/30/15 Yes Nira Conn, MD  amLODipine (NORVASC) 10 MG tablet Take 10 mg by mouth daily.   Yes Historical Provider, MD  amlodipine-atorvastatin (CADUET) 10-10 MG tablet Take 1 tablet by mouth daily. Pt not taking   Yes Historical Provider, MD  aspirin 81 MG tablet Take 81 mg by mouth daily.   Yes Historical Provider, MD  cholecalciferol  (VITAMIN D) 1000 units tablet Take 1,000 Units by mouth daily.   Yes Historical Provider, MD  ibuprofen (ADVIL) 200 MG tablet Take 3 tablets (600 mg total) by mouth every 6 (six) hours as needed for moderate pain. 10/28/15  Yes Nira Conn, MD  Multiple Vitamin (MULTIVITAMIN WITH MINERALS) TABS tablet Take 1 tablet by mouth daily.   Yes Historical Provider, MD  oxyCODONE-acetaminophen (PERCOCET/ROXICET) 5-325 MG tablet Take 1 tablet by mouth every 8 (eight) hours as needed for severe pain (If pain is not controlled with Tylenol. Do not exceed 4000 mg of acetaminophen in a 24-hour period this medicine also contains acetaminophen so care should be taken.). 10/28/15  Yes Nira Conn, MD  XIIDRA 5 % SOLN Place 1 drop into both eyes daily. 09/17/15  Yes Historical Provider, MD     Positive ROS: All other systems have been reviewed and were otherwise negative with the exception of those mentioned in the HPI and as above.  Physical Exam: General: Alert, no acute distress Cardiovascular: No pedal edema Respiratory: No cyanosis, no use of accessory musculature GI: abdomen soft Skin: No lesions in the area of chief complaint Neurologic: Sensation intact distally Psychiatric: Patient is competent for consent with normal mood and affect Lymphatic: no lymphedema  MUSCULOSKELETAL: exam stable  Assessment: right distal radius fracture  Plan: Plan for Procedure(s): OPEN REDUCTION INTERNAL FIXATION (ORIF) RIGHT DISTAL RADIUS FRACTURE Closed treatment of left pubic ramus fracture  The risks benefits and alternatives were discussed with the patient including but not limited to the risks of nonoperative treatment, versus surgical intervention including infection, bleeding, nerve injury,  blood clots, cardiopulmonary complications, morbidity, mortality, among others, and they were willing to proceed.   Cheral Almas, MD   10/29/2015 1:40 PM

## 2015-10-29 NOTE — Op Note (Addendum)
DATE OF SURGERY: 10/29/2015  PREOPERATIVE DIAGNOSIS: right distal radius fracture  POSTOPERATIVE DIAGNOSIS: same  PROCEDURE: Open reduction and internal fixation, Right  distal radius. CPT [phone] 3 or more fragments  IMPLANT:  Implant Name Type Inv. Item Serial No. Manufacturer Lot No. LRB No. Used  PUTTY DBM STAGRAFT PLUS 2CC - [id number] Putty PUTTY DBM STAGRAFT PLUS 2CC  [employer]OM[employer] TRAUMA DP [id number] Right 1  PLATE DVR CROSSLOCK STD RT - [id number] Plate PLATE DVR CROSSLOCK STD RT  [employer] DP  Right 1  SCREW LOCKING 2.7X14 - [id number] Screw SCREW LOCKING 2.7X14  [employer] TRAUMA DP  Right 1  SCREW LOCKING 2.7X18 - [id number] Screw SCREW LOCKING 2.7X18  [employer]ET TRAUMA DP  Right 3  SCREW LOCKING 2.7X20MM - [id number] Screw SCREW LOCKING 2.7X20MM  BI[employer]ET TRAUMA DP  Right 1  SCREW LOCKING 2.7X16 - [id number] Screw SCREW LOCKING 2.7X16  [employer]ET TRAUMA DP  Right 1  SCREW LOCKING 2.7X22MM - [id number] Screw SCREW LOCKING 2.7X22MM  BI[employer]ET TRAUMA DP  Right 1  SCREW LOCKING 2.7X12MM - [id number] Screw SCREW LOCKING 2.7X12MM  [employer] TRAUMA DP  Right 1  SCREW LOCKING 2.7X12MM - [id number] Screw SCREW LOCKING 2.7X12MM  BI[employer]ET TRAUMA DP  Right 1  SCREW N/L 2.7X14 - [id number] Screw SCREW N/L 2.7X14  BI[employer]ET TRAUMA DP  Right 1  SCREW LOCKING 2.7X13MM - [id number] Screw SCREW LOCKING 2.7X13MM  BIOMET TRAUMA DP  Right 1  SCREW LOCKING 2.7X20MM - [id number] Screw SCREW LOCKING 2.7X20MM   BIOMET TRAUMA DP   Right 1     SURGEON: Surgeon(s) and Role:    * Tiara Bartoli Donnelly Stager, MD - Primary  ANESTHESIA: General  TOURNIQUET TIME: less than 60 minutes  BLOOD LOSS: Minimal.  COMPLICATIONS: None.  PATHOLOGY: None.  TIME OUT: Performed before the start of procedure.  INDICATIONS: The patient is a 70 y.o. female who presented with a displaced right distal radius fracture indicated for osteosynthesis.  DESCRIPTION OF PROCEDURE:  The patient was identified in the preoperative holding area.  The operative site was marked by  the surgeon and confirmed by the patient.  He was brought back to the operating room.  Anesthesia was induced by the anesthesia team.  A well padded nonsterile tourniquet was placed. The operative extremity was prepped and draped in standard sterile fashion.  A timeout was performed.  Preoperative antibiotics were given. The extremity was exsanguinated, tourniquet inflated to 275 mmHg.  A FCR approach was used.  Dissection through the sheath of FCR was carried out and the FCR tendon was retracted. The floor of FCR sheath was released. Flexor tendons and median nerve were retracted ulnarly and protected. Pronator quadratus was released from distal radius. Fracture lines were visualized. Fracture was reduced under fluoroscopy. Due to bone loss, 2 cc of DBM putty was placed in the void.  A volar locking plate was applied to the volar surface of the distal radius. Distal locking screws and nonlocking shaft  screws were inserted. Fluoroscopy was used to show adequate reduction. The tourniquet was deflated. Hemostasis was achieved. The wound was irrigated. Incision closed in layers. Sterile dressing applied. Wrist immobilized in a removable brace. The patient was transferred to the recovery room in stable condition after all counts were correct.  POSTOPERATIVE PLAN: The patient will remain in the brace until instructed. Sutures will be removed in two weeks. About four weeks after surgery, will start wrist range of motion exercises, but in the meantime before then she will start aggressive finger range of  motion exercises without resistance.  The "six pack of hand exercises" handout was given to the patient.

## 2015-10-29 NOTE — Discharge Instructions (Signed)
Postoperative instructions: ° °Weightbearing: non weight bearing ° °Dressing instructions: Keep your dressing and/or splint clean and dry at all times.  It will be removed at your first post-operative appointment.  Your stitches and/or staples will be removed at this visit. ° °Incision instructions:  Do not soak your incision for 3 weeks after surgery.  If the incision gets wet, pat dry and do not scrub the incision. ° °Pain control:  You have been given a prescription to be taken as directed for post-operative pain control.  In addition, elevate the operative extremity above the heart at all times to prevent swelling and throbbing pain. ° °Take over-the-counter Colace, 100mg by mouth twice a day while taking narcotic pain medications to help prevent constipation. ° °Follow up appointments: °1) 10-14 days for suture removal and wound check. °2) Dr. Breda Bond as scheduled. ° ° ------------------------------------------------------------------------------------------------------------- ° °After Surgery Pain Control: ° °After your surgery, post-surgical discomfort or pain is likely. This discomfort can last several days to a few weeks. At certain times of the day your discomfort may be more intense.  °Did you receive a nerve block?  °A nerve block can provide pain relief for one hour to two days after your surgery. As long as the nerve block is working, you will experience little or no sensation in the area the surgeon operated on.  °As the nerve block wears off, you will begin to experience pain or discomfort. It is very important that you begin taking your prescribed pain medication before the nerve block fully wears off. Treating your pain at the first sign of the block wearing off will ensure your pain is better controlled and more tolerable when full-sensation returns. Do not wait until the pain is intolerable, as the medicine will be less effective. It is better to treat pain in advance than to try and catch up.    °General Anesthesia:  °If you did not receive a nerve block during your surgery, you will need to start taking your pain medication shortly after your surgery and should continue to do so as prescribed by your surgeon.  °Pain Medication:  °Most commonly we prescribe Vicodin and Percocet for post-operative pain. Both of these medications contain a combination of acetaminophen (Tylenol®) and a narcotic to help control pain.  °· It takes between 30 and 45 minutes before pain medication starts to work. It is important to take your medication before your pain level gets too intense.  °· Nausea is a common side effect of many pain medications. You will want to eat something before taking your pain medicine to help prevent nausea.  °· If you are taking a prescription pain medication that contains acetaminophen, we recommend that you do not take additional over the counter acetaminophen (Tylenol®).  °Other pain relieving options:  °· Using a cold pack to ice the affected area a few times a day (15 to 20 minutes at a time) can help to relieve pain, reduce swelling and bruising.  °· Elevation of the affected area can also help to reduce pain and swelling. ° ° ° °

## 2015-10-30 ENCOUNTER — Encounter (HOSPITAL_COMMUNITY): Payer: Self-pay | Admitting: Orthopaedic Surgery

## 2015-10-30 NOTE — Anesthesia Postprocedure Evaluation (Signed)
Anesthesia Post Note  Patient: Renee Riley  Procedure(s) Performed: Procedure(s) (LRB): OPEN REDUCTION INTERNAL FIXATION (ORIF) RIGHT DISTAL RADIUS FRACTURE (Right)  Patient location during evaluation: PACU Anesthesia Type: General Level of consciousness: awake and alert Pain management: pain level controlled Vital Signs Assessment: post-procedure vital signs reviewed and stable Respiratory status: spontaneous breathing, nonlabored ventilation, respiratory function stable and patient connected to nasal cannula oxygen Cardiovascular status: blood pressure returned to baseline and stable Postop Assessment: no signs of nausea or vomiting Anesthetic complications: no    Last Vitals:  Vitals:   10/29/15 1649 10/29/15 1715  BP: (!) 152/75   Pulse: 81 68  Resp: 20 12  Temp:  36.7 C    Last Pain:  Vitals:   10/29/15 1715  TempSrc:   PainSc: 0-No pain                 Ginevra Tacker,JAMES TERRILL

## 2015-11-11 ENCOUNTER — Encounter (HOSPITAL_COMMUNITY): Payer: Self-pay | Admitting: Orthopaedic Surgery

## 2015-11-11 NOTE — Addendum Note (Signed)
Addendum  created 11/11/15 [id number] by Sharee Holstererry Merleen Picazo, MD   Anesthesia Staff edited

## 2016-01-22 ENCOUNTER — Ambulatory Visit (INDEPENDENT_AMBULATORY_CARE_PROVIDER_SITE_OTHER): Payer: Medicare Other | Admitting: Orthopaedic Surgery

## 2016-01-22 ENCOUNTER — Ambulatory Visit (INDEPENDENT_AMBULATORY_CARE_PROVIDER_SITE_OTHER): Payer: Medicare Other

## 2016-01-22 ENCOUNTER — Encounter (INDEPENDENT_AMBULATORY_CARE_PROVIDER_SITE_OTHER): Payer: Self-pay | Admitting: Orthopaedic Surgery

## 2016-01-22 DIAGNOSIS — M25531 Pain in right wrist: Secondary | ICD-10-CM

## 2016-01-22 DIAGNOSIS — S52501D Unspecified fracture of the lower end of right radius, subsequent encounter for closed fracture with routine healing: Secondary | ICD-10-CM | POA: Insufficient documentation

## 2016-01-22 DIAGNOSIS — S52601D Unspecified fracture of lower end of right ulna, subsequent encounter for closed fracture with routine healing: Secondary | ICD-10-CM

## 2016-01-22 NOTE — Progress Notes (Signed)
   Office Visit Note   Patient: Renee Riley           Date of Birth: 01/04/1946           MRN: [id number] Visit Date: 01/22/2016              Requested by: No referring provider defined for this encounter. PCP: No PCP Per Patient   Assessment & Plan: Visit Diagnoses:  1. Closed fracture of right distal radius and ulna, with routine healing, subsequent encounter   2. Pain in right wrist     Plan:  - progressing well with hand therapy - continue with therapy - f/u 3 months with repeat wrist xrays  Follow-Up Instructions: Return in about 3 months (around 04/23/2016) for recheck right wrist.   Orders:  Orders Placed This Encounter  Procedures  . XR Wrist Complete Right   No orders of the defined types were placed in this encounter.     Procedures: No procedures performed   Clinical Data: No additional findings.   Subjective: Chief Complaint  Patient presents with  . Right Wrist - Routine Post Op, Follow-up    12 WEEKS PO    3 month f/u visit for right distal radius fracture.  Overall improving.  Doing hand therapy.  Denies any worsening sxs related to hardware.    Review of Systems   Objective: Vital Signs: There were no vitals taken for this visit.  Physical Exam  Right Hand Exam   Other  Sensation: normal Pulse: present  Comments:  Well healed scar.  Flexion: 50 degrees; Extension: 45 degrees.  Mild swelling.        Specialty Comments:  No specialty comments available.  Imaging: No results found.   PMFS History: Patient Active Problem List   Diagnosis Date Noted  . Closed fracture of right distal radius and ulna, with routine healing, subsequent encounter 01/22/2016   Past Medical History:  Diagnosis Date  . Arthritis   . Distal radius fracture, right   . Dry eyes   . GERD (gastroesophageal reflux disease)   . Headache   . Hypertension   . Osteoporosis   . PONV (postoperative nausea and vomiting)     Family History  Problem  Relation Age of Onset  . Kidney Stones Mother   . Osteoporosis Mother   . Cancer Father     Past Surgical History:  Procedure Laterality Date  . CATARACT EXTRACTION     right eye  . DILATION AND CURETTAGE OF UTERUS    . FRACTURE SURGERY     Left hip   . JOINT REPLACEMENT     right hip  . OPEN REDUCTION INTERNAL FIXATION (ORIF) DISTAL RADIAL FRACTURE Right 10/29/2015   Procedure: OPEN REDUCTION INTERNAL FIXATION (ORIF) RIGHT DISTAL RADIUS FRACTURE;  Surgeon: Tarry KosNaiping M Xu, MD;  Location: [facility] OR;  Service: Orthopedics;  Laterality: Right;   Social History   Occupational History  . Not on file.   Social History Main Topics  . Smoking status: Never Smoker  . Smokeless tobacco: Never Used  . Alcohol use Yes     Comment: occasional  . Drug use: No  . Sexual activity: Not on file

## 2016-04-22 ENCOUNTER — Ambulatory Visit (INDEPENDENT_AMBULATORY_CARE_PROVIDER_SITE_OTHER): Payer: Medicare Other | Admitting: Orthopaedic Surgery

## 2016-04-22 ENCOUNTER — Ambulatory Visit (INDEPENDENT_AMBULATORY_CARE_PROVIDER_SITE_OTHER): Payer: Medicare Other

## 2016-04-22 ENCOUNTER — Encounter (INDEPENDENT_AMBULATORY_CARE_PROVIDER_SITE_OTHER): Payer: Self-pay | Admitting: Orthopaedic Surgery

## 2016-04-22 DIAGNOSIS — S52501D Unspecified fracture of the lower end of right radius, subsequent encounter for closed fracture with routine healing: Secondary | ICD-10-CM

## 2016-04-22 DIAGNOSIS — S52601D Unspecified fracture of lower end of right ulna, subsequent encounter for closed fracture with routine healing: Secondary | ICD-10-CM | POA: Diagnosis not present

## 2016-04-22 NOTE — Progress Notes (Signed)
   Office Visit Note   Patient: Renee Riley           Date of Birth: 10/31/1945           MRN: [id number] Visit Date: 04/22/2016              Requested by: No referring provider defined for this encounter. PCP: No PCP Per Patient   Assessment & Plan: Visit Diagnoses:  1. Closed fracture of right distal radius and ulna, with routine healing, subsequent encounter     Plan: X-rays show healed distal radius fracture. Patient has reached MMI. Follow up as needed.  Follow-Up Instructions: Return if symptoms worsen or fail to improve.   Orders:  Orders Placed This Encounter  Procedures  . XR Wrist Complete Right   No orders of the defined types were placed in this encounter.     Procedures: No procedures performed   Clinical Data: No additional findings.   Subjective: Chief Complaint  Patient presents with  . Right Wrist - Follow-up  . Follow-up    Patient is 6 months status post operative fixation right distal radius fractures. She is doing well. She just has some stiffness.      Review of Systems   Objective: Vital Signs: There were no vitals taken for this visit.  Physical Exam  Ortho Exam Right wrist extension 60 flexion 55. Well-healed surgical scar. Specialty Comments:  No specialty comments available.  Imaging: Xr Wrist Complete Right  Result Date: 04/22/2016 Healed distal radius fracture without hardware complication    PMFS History: Patient Active Problem List   Diagnosis Date Noted  . Closed fracture of right distal radius and ulna, with routine healing, subsequent encounter 01/22/2016   Past Medical History:  Diagnosis Date  . Arthritis   . Distal radius fracture, right   . Dry eyes   . GERD (gastroesophageal reflux disease)   . Headache   . Hypertension   . Osteoporosis   . PONV (postoperative nausea and vomiting)     Family History  Problem Relation Age of Onset  . Kidney Stones Mother   . Osteoporosis Mother   . Cancer  Father     Past Surgical History:  Procedure Laterality Date  . CATARACT EXTRACTION     right eye  . DILATION AND CURETTAGE OF UTERUS    . FRACTURE SURGERY     Left hip   . JOINT REPLACEMENT     right hip  . OPEN REDUCTION INTERNAL FIXATION (ORIF) DISTAL RADIAL FRACTURE Right 10/29/2015   Procedure: OPEN REDUCTION INTERNAL FIXATION (ORIF) RIGHT DISTAL RADIUS FRACTURE;  Surgeon: Tarry KosNaiping M Andreana Klingerman, MD;  Location: [facility] OR;  Service: Orthopedics;  Laterality: Right;   Social History   Occupational History  . Not on file.   Social History Main Topics  . Smoking status: Never Smoker  . Smokeless tobacco: Never Used  . Alcohol use Yes     Comment: occasional  . Drug use: No  . Sexual activity: Not on file

## 2016-05-12 ENCOUNTER — Other Ambulatory Visit: Payer: Self-pay | Admitting: Internal Medicine

## 2016-05-12 DIAGNOSIS — Z1231 Encounter for screening mammogram for malignant neoplasm of breast: Secondary | ICD-10-CM

## 2016-06-02 ENCOUNTER — Ambulatory Visit
Admission: RE | Admit: 2016-06-02 | Discharge: 2016-06-02 | Disposition: A | Discharge status: Home / Self Care | Payer: Medicare Other | Source: Ambulatory Visit | Attending: Internal Medicine | Admitting: Internal Medicine

## 2016-06-02 DIAGNOSIS — Z1231 Encounter for screening mammogram for malignant neoplasm of breast: Secondary | ICD-10-CM

## 2016-06-11 ENCOUNTER — Other Ambulatory Visit: Payer: Self-pay | Admitting: Internal Medicine

## 2016-06-11 DIAGNOSIS — R928 Other abnormal and inconclusive findings on diagnostic imaging of breast: Secondary | ICD-10-CM

## 2016-06-16 ENCOUNTER — Other Ambulatory Visit: Payer: Medicare Other

## 2016-06-18 ENCOUNTER — Ambulatory Visit
Admission: RE | Admit: 2016-06-18 | Discharge: 2016-06-18 | Disposition: A | Discharge status: Home / Self Care | Payer: Medicare Other | Source: Ambulatory Visit | Attending: Internal Medicine | Admitting: Internal Medicine

## 2016-06-18 DIAGNOSIS — R928 Other abnormal and inconclusive findings on diagnostic imaging of breast: Secondary | ICD-10-CM

## 2017-06-20 ENCOUNTER — Other Ambulatory Visit: Payer: Self-pay | Admitting: Internal Medicine

## 2017-06-20 DIAGNOSIS — Z1231 Encounter for screening mammogram for malignant neoplasm of breast: Secondary | ICD-10-CM

## 2017-07-11 ENCOUNTER — Ambulatory Visit
Admission: RE | Admit: 2017-07-11 | Discharge: 2017-07-11 | Disposition: A | Discharge status: Home / Self Care | Payer: Medicare Other | Source: Ambulatory Visit | Attending: Internal Medicine | Admitting: Internal Medicine

## 2017-07-11 DIAGNOSIS — Z1231 Encounter for screening mammogram for malignant neoplasm of breast: Secondary | ICD-10-CM

## 2017-08-12 IMAGING — CR DG HIP (WITH OR WITHOUT PELVIS) 4+V*L*
3 series · 3 of 3 positions shown · non-contrast
Comparison: None.

CLINICAL DATA: Fall after a leash got tangled around the patient's
ankles. Left hip pain and tenderness. Left-sided low back pain.
Initial encounter.

EXAM:
DG HIP (WITH OR WITHOUT PELVIS) 4+V LEFT

[t hip ap left (1 of 2)]
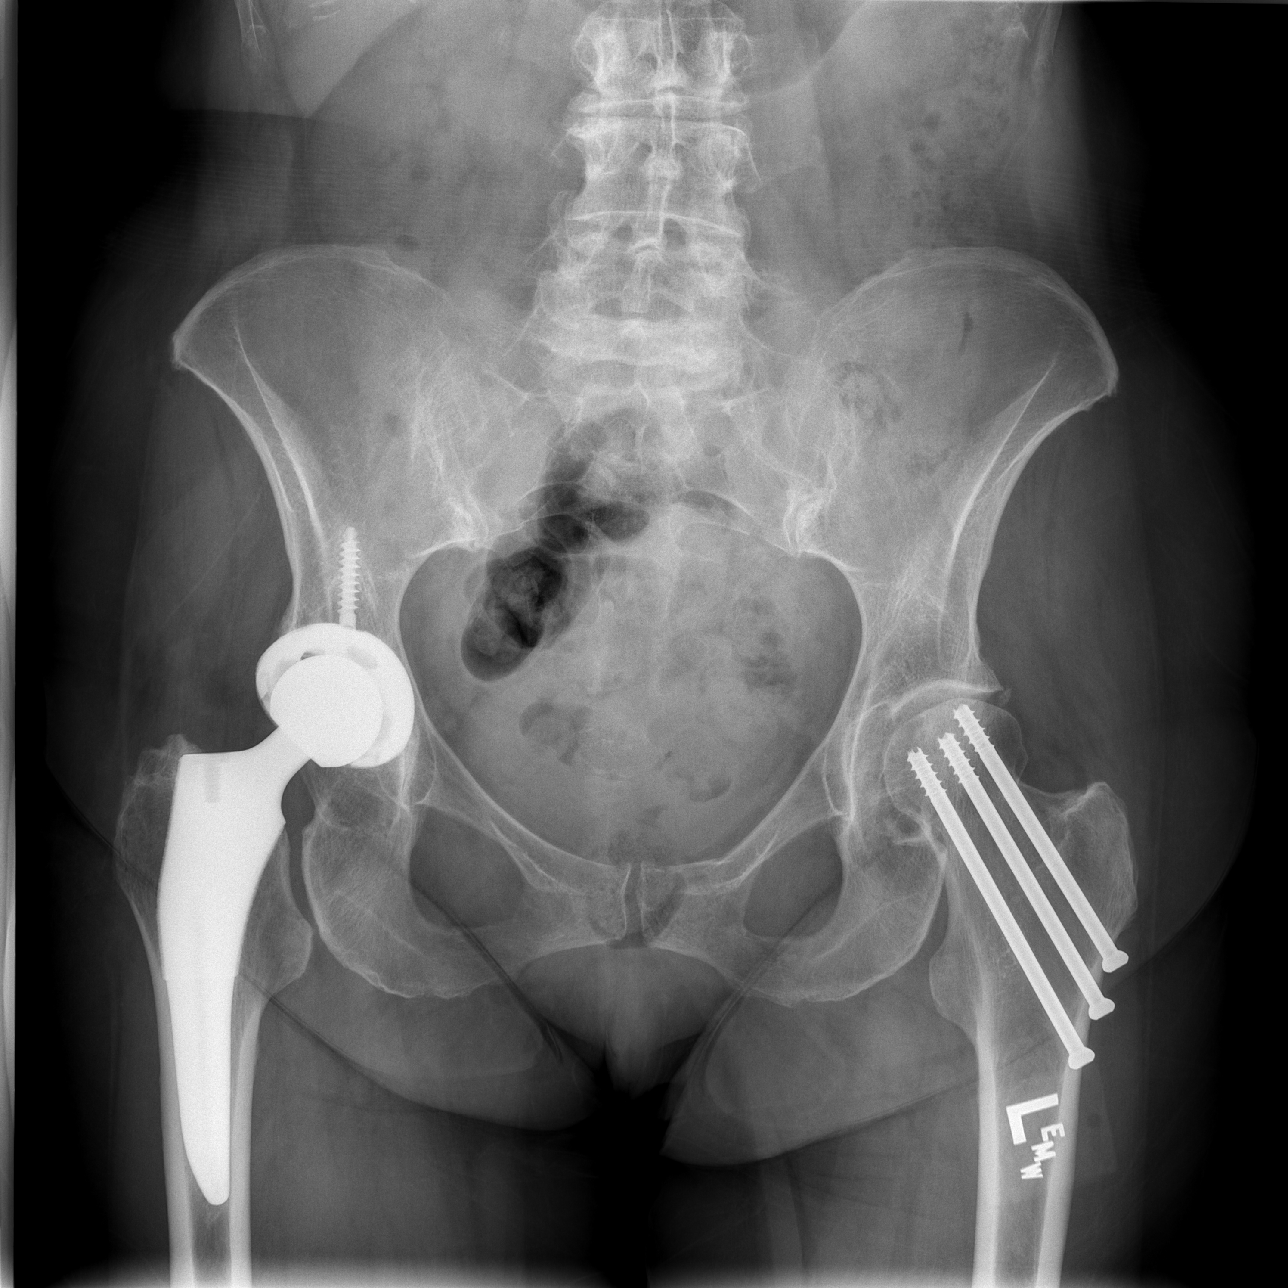

[t hip ap left (2 of 2)]
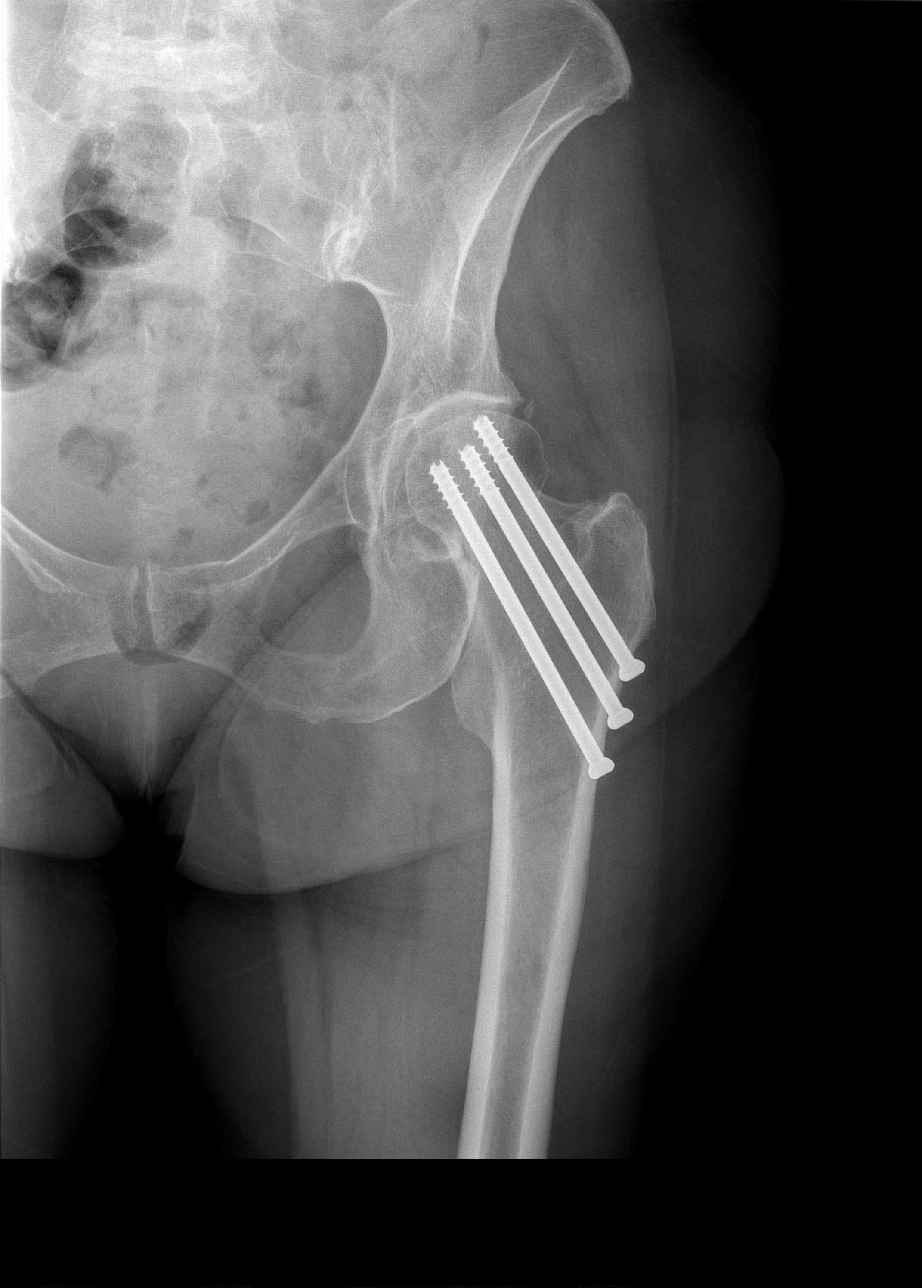

[t hip frog leg left]
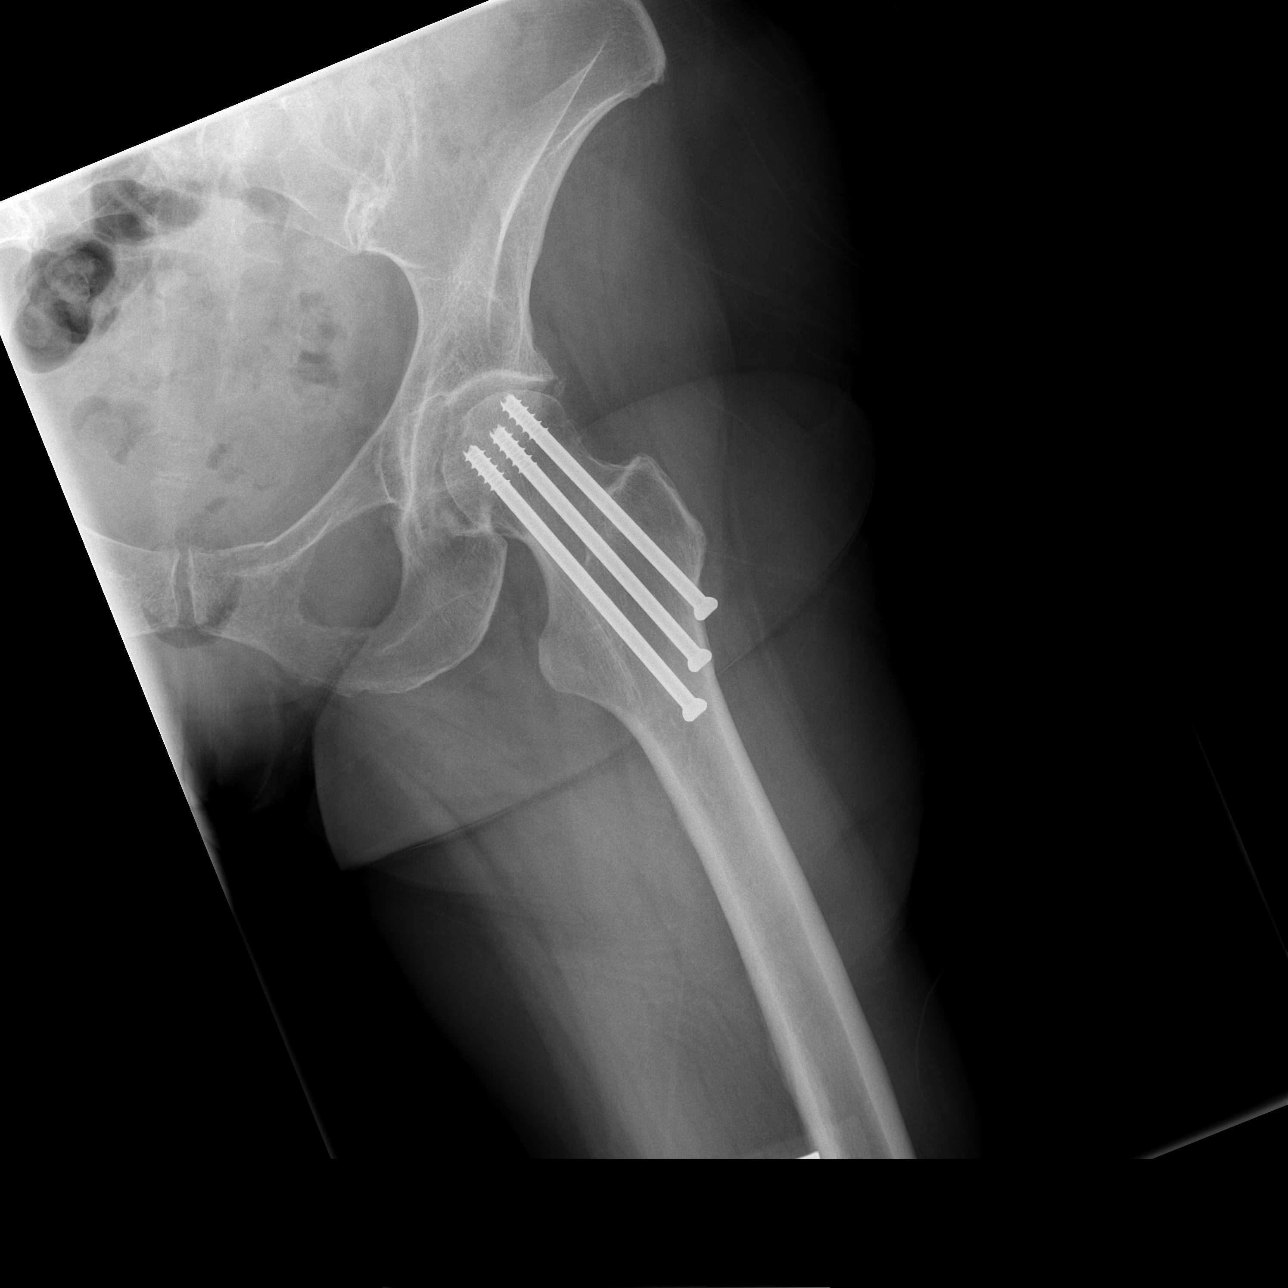

[3 of 3 positions shown; findings below may reference images not displayed]

FINDINGS: There is evidence of an old proximal left femur fracture with 3
cannulated lag screws terminating in the femoral head. The left hip
is located without an acute fracture identified. A small ossicle
adjacent to the superolateral left acetabulum may be related to
remote trauma or be developmental. Limited evaluation of a right
total hip arthroplasty is unremarkable.
IMPRESSION: Posttraumatic and postoperative changes without evidence of acute
osseous abnormality.

## 2017-08-12 IMAGING — CR DG WRIST COMPLETE 3+V*R*
4 series · 4 of 4 positions shown · non-contrast
Comparison: None.

CLINICAL DATA: Pt c/o right wrist pain and tenderness, right knee
pain and tenderness-right knee laceration to lateral aspect, left
hip pain and tenderness, left sided lower back pain all since
falling this morning after getting a leash tangled around her ankles

EXAM:
RIGHT WRIST - COMPLETE 3+ VIEW

[x wrist pa right]
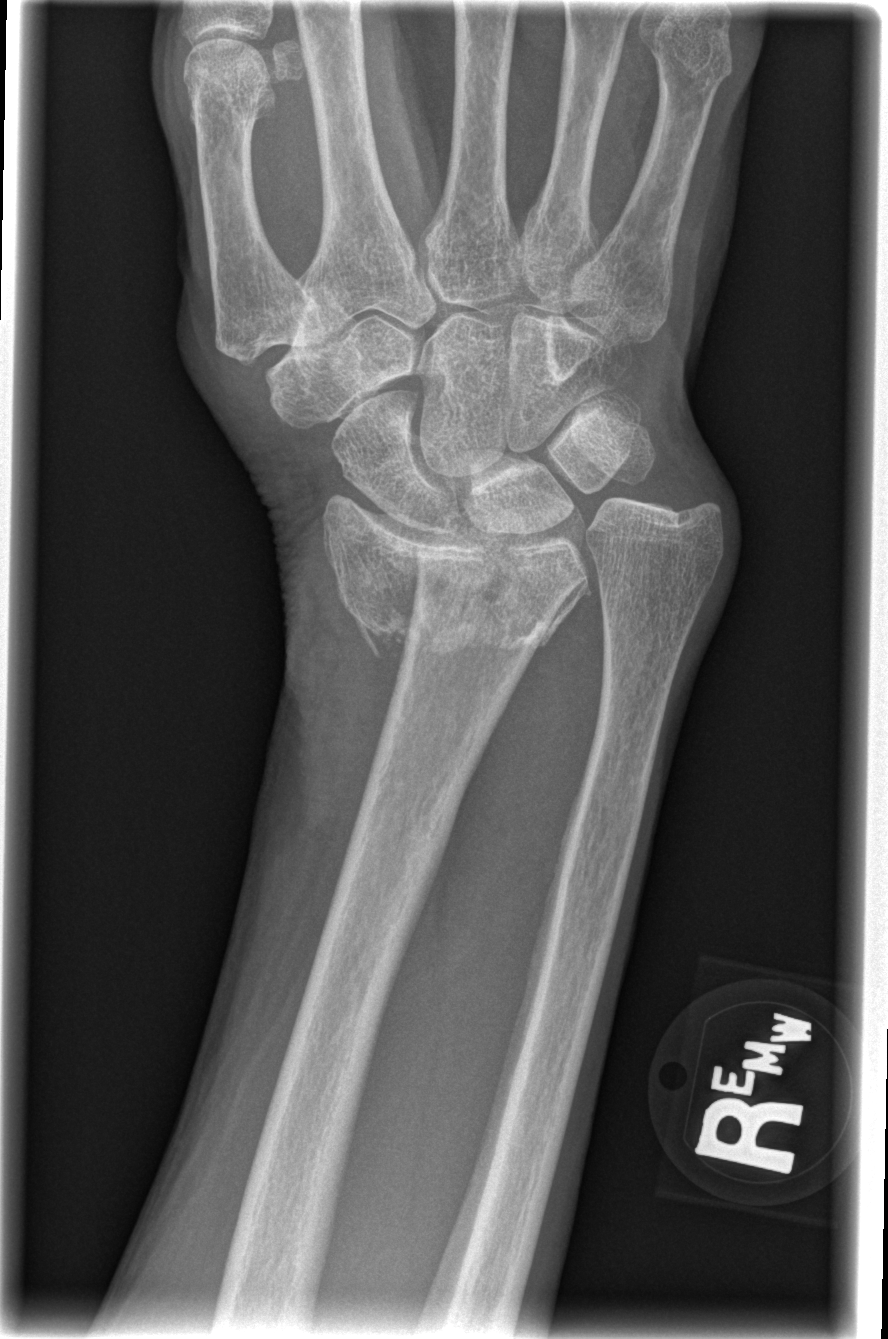

[x wrist obl right]
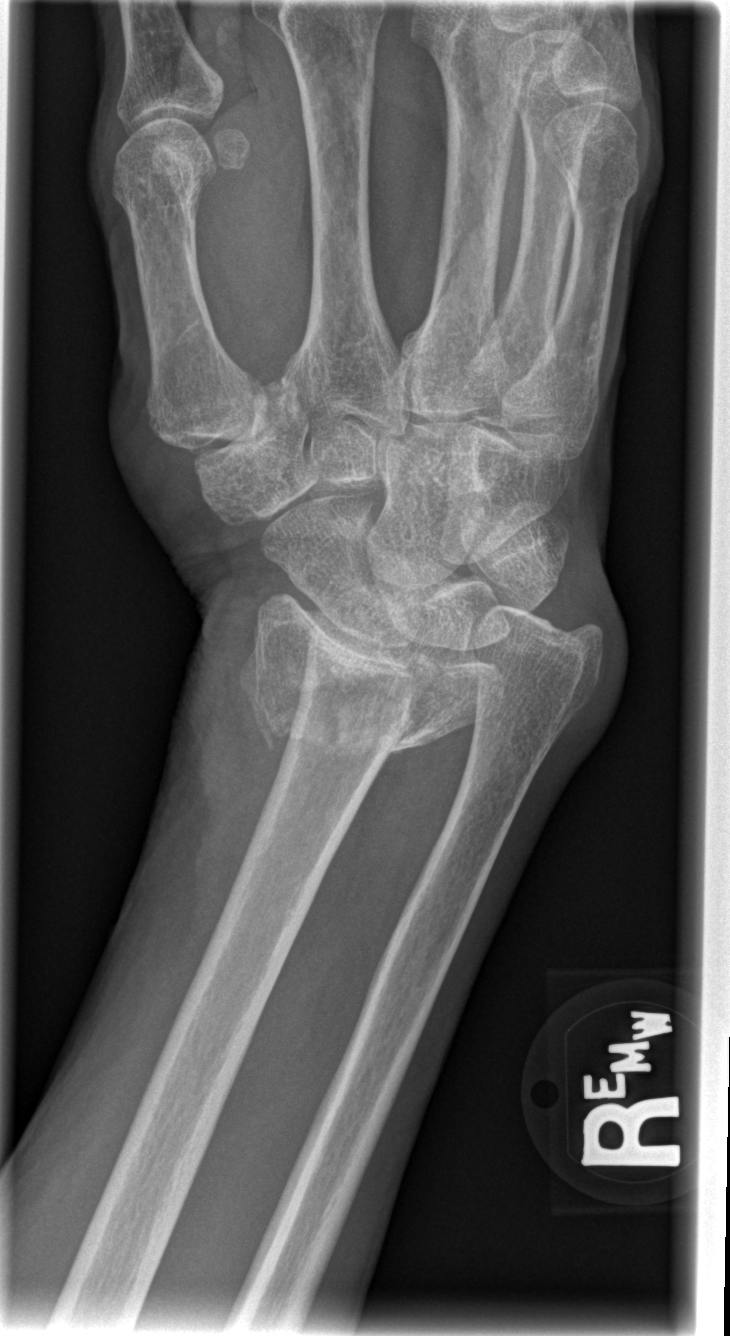

[x wrist lat right]
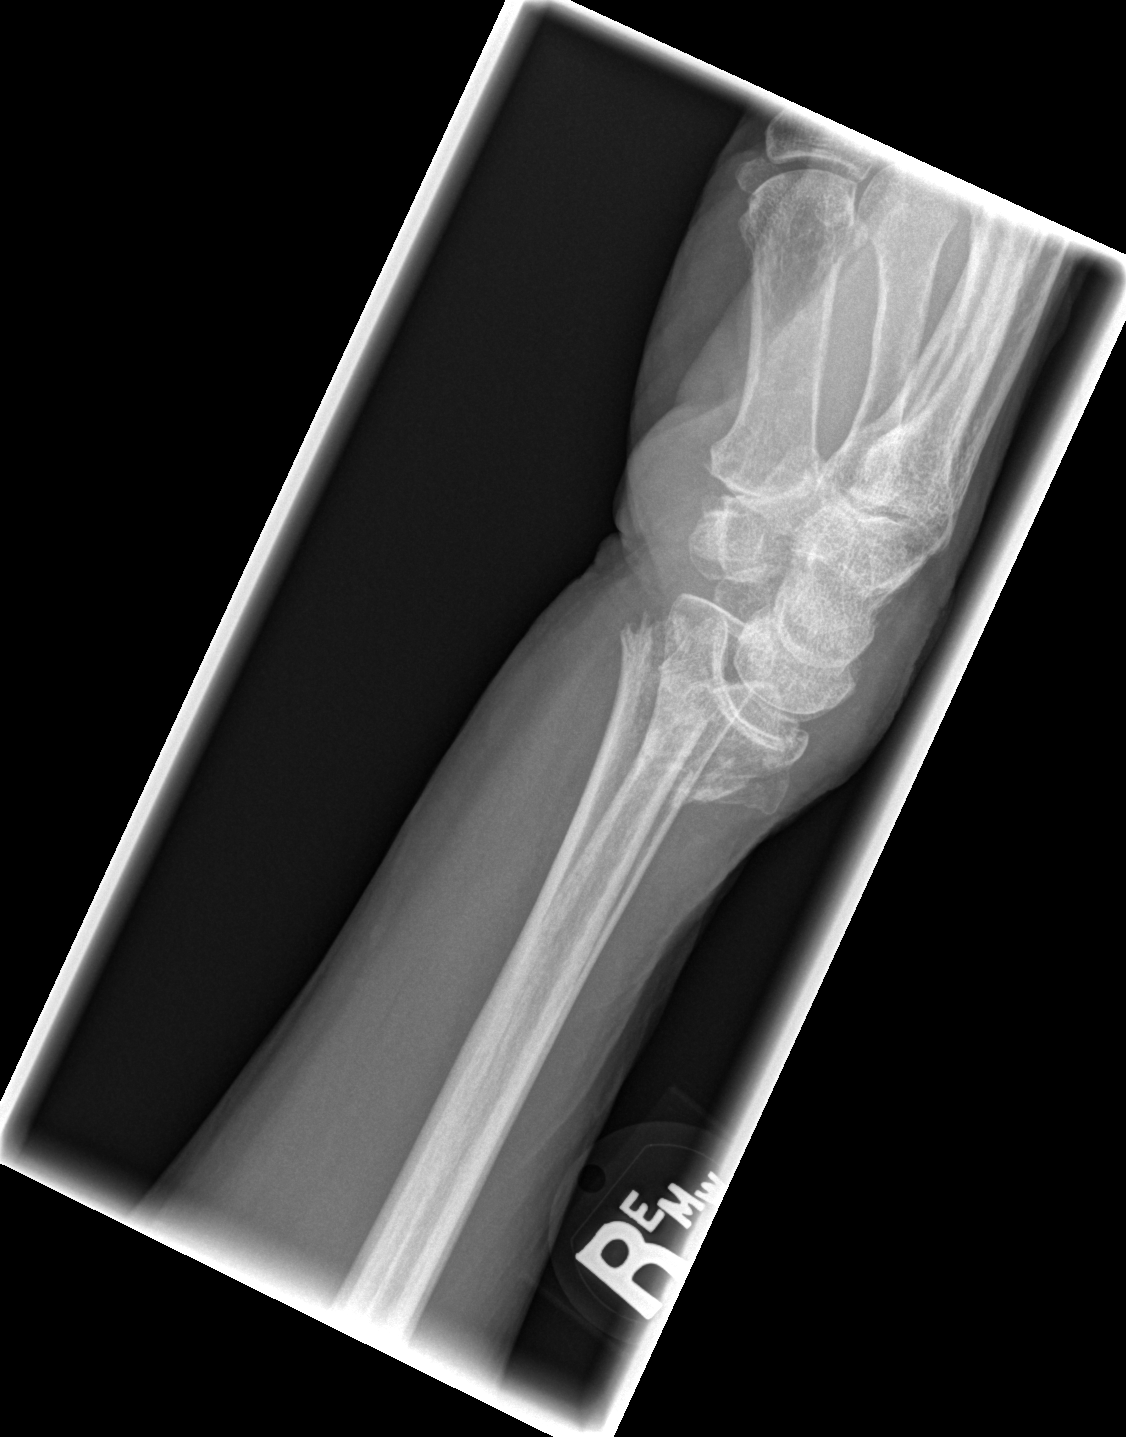

[x navicular]
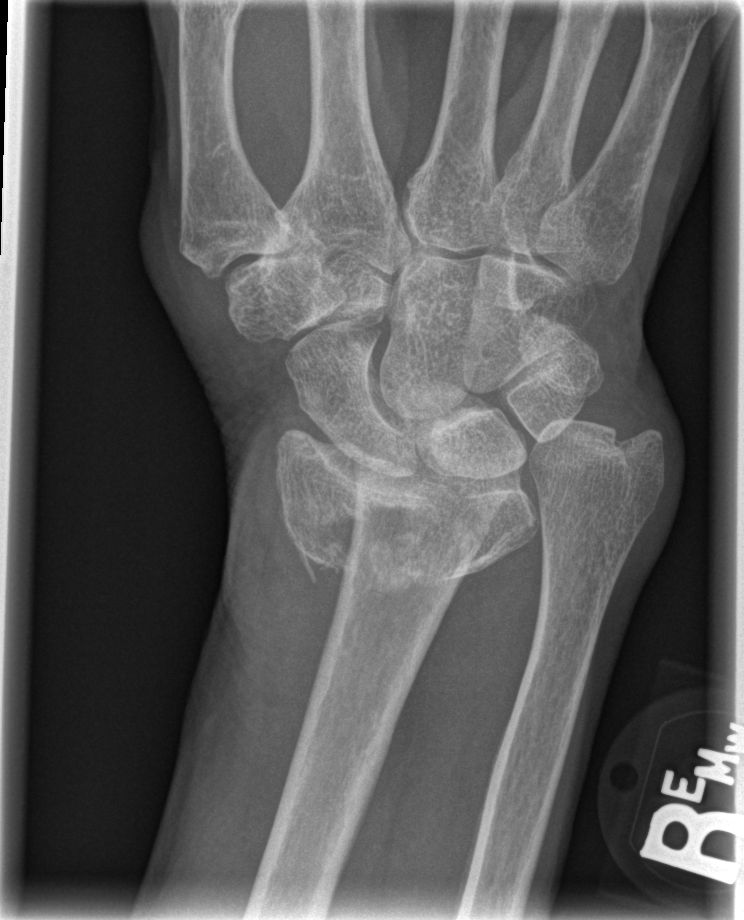

[4 of 4 positions shown; findings below may reference images not displayed]

FINDINGS: There is a comminuted fracture of the distal right radius, extending
to the articular surface and affecting radial ulnar joint. Ulnar
plus variance noted. There is dorsal tilting dorsal displacement by
[DATE] shaft width. Intercarpal alignment appears normal. Distal ulna
is intact. Degenerative changes are identified at the first
carpometacarpal joint.
IMPRESSION: Comminuted fracture of the distal radius affecting the radiocarpal
joint and radioulnar joint.

Ulnar plus variance.

## 2017-08-12 IMAGING — CR DG KNEE COMPLETE 4+V*R*
4 series · 4 of 4 positions shown · non-contrast
Comparison: None.

CLINICAL DATA: Pain and tenderness following fall. Laceration
laterally

EXAM:
RIGHT KNEE - COMPLETE 4+ VIEW

[t knee ap right]
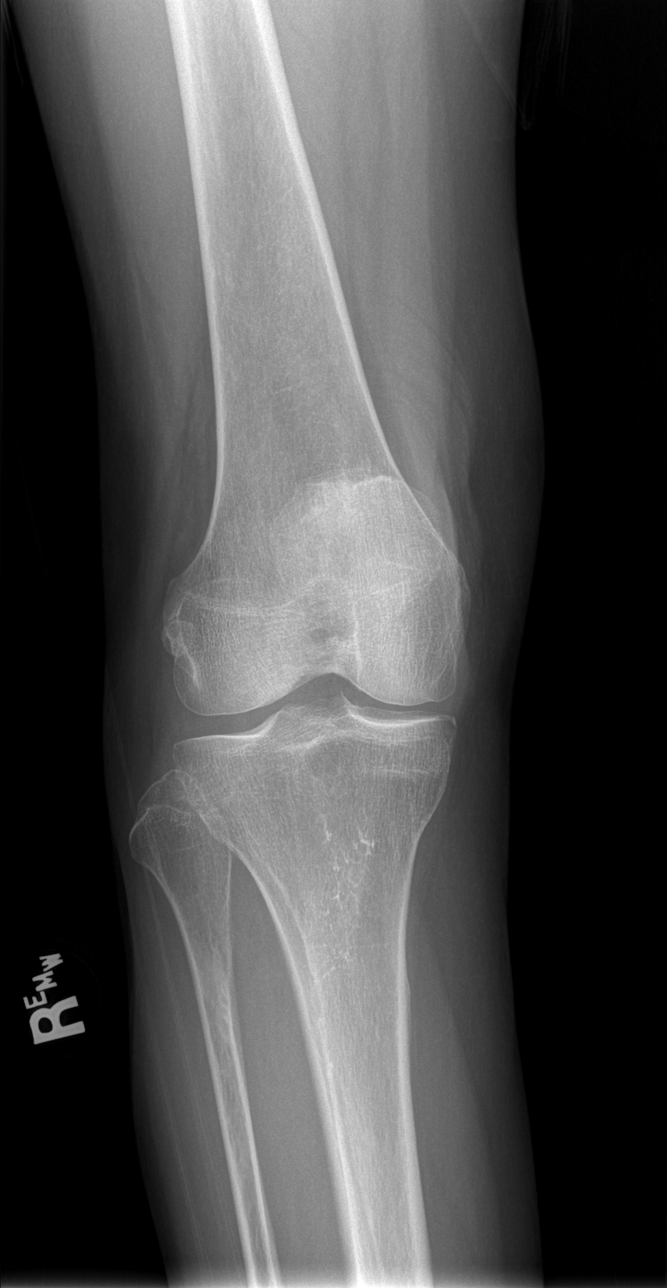

[t knee oblique right (1 of 2)]
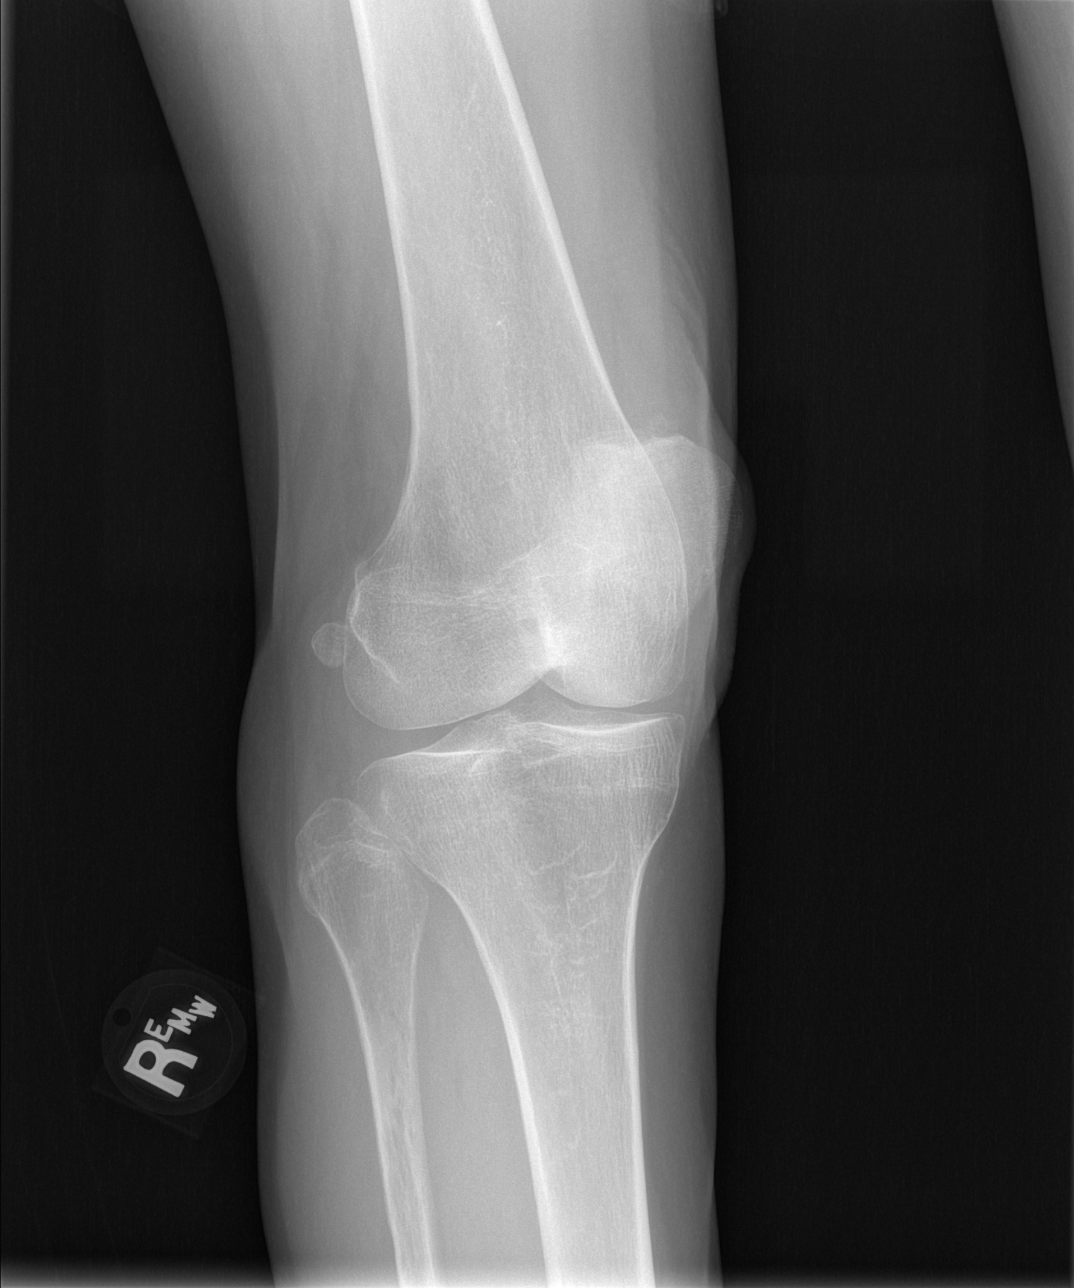

[t knee oblique right (2 of 2)]
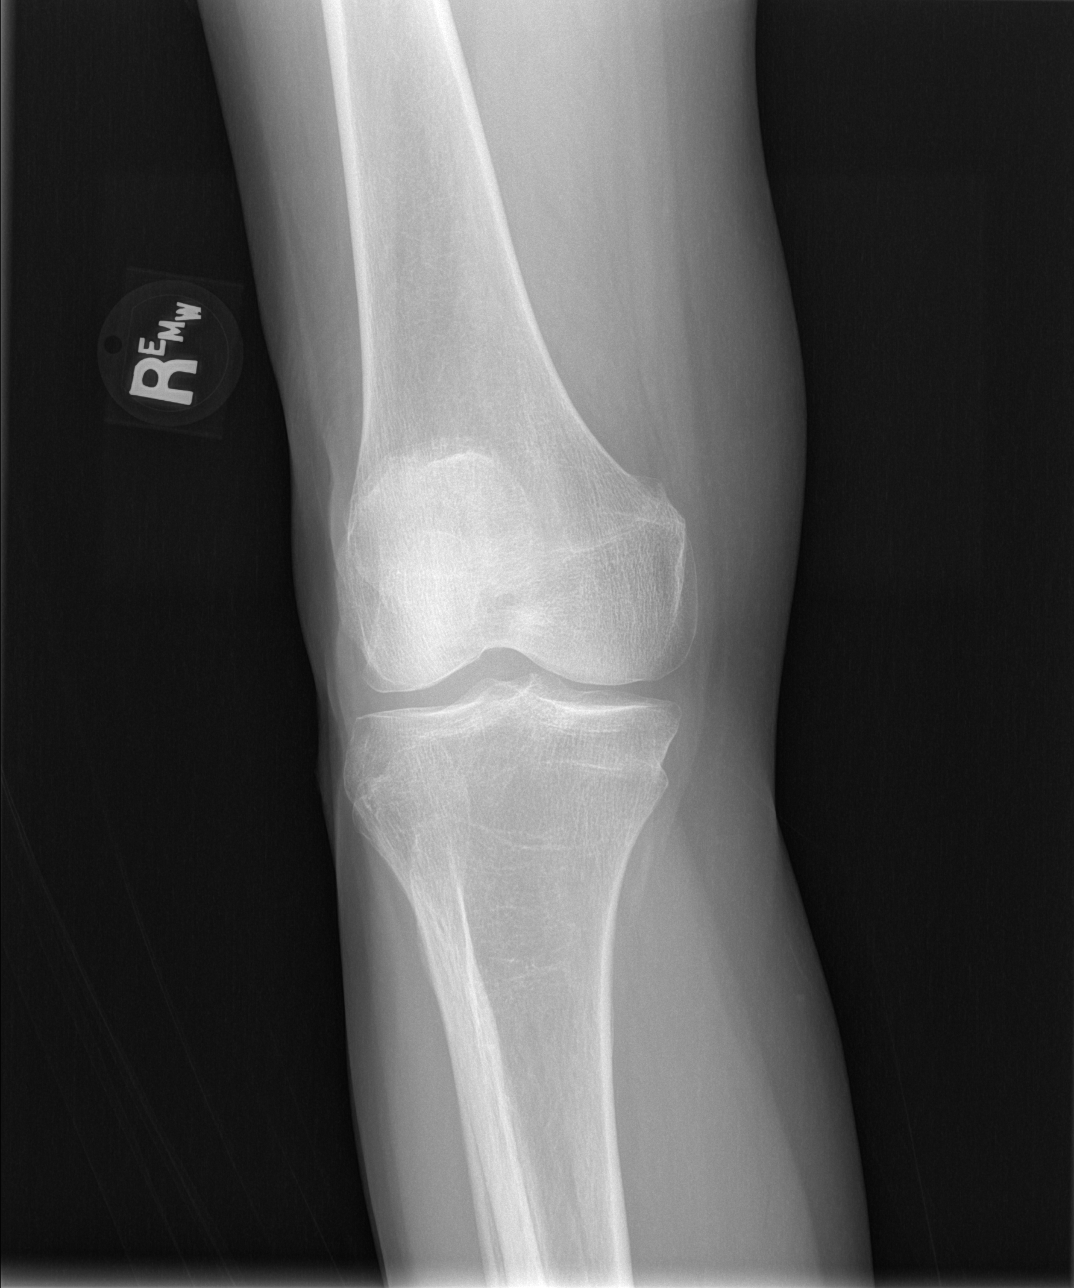

[t knee lat right]
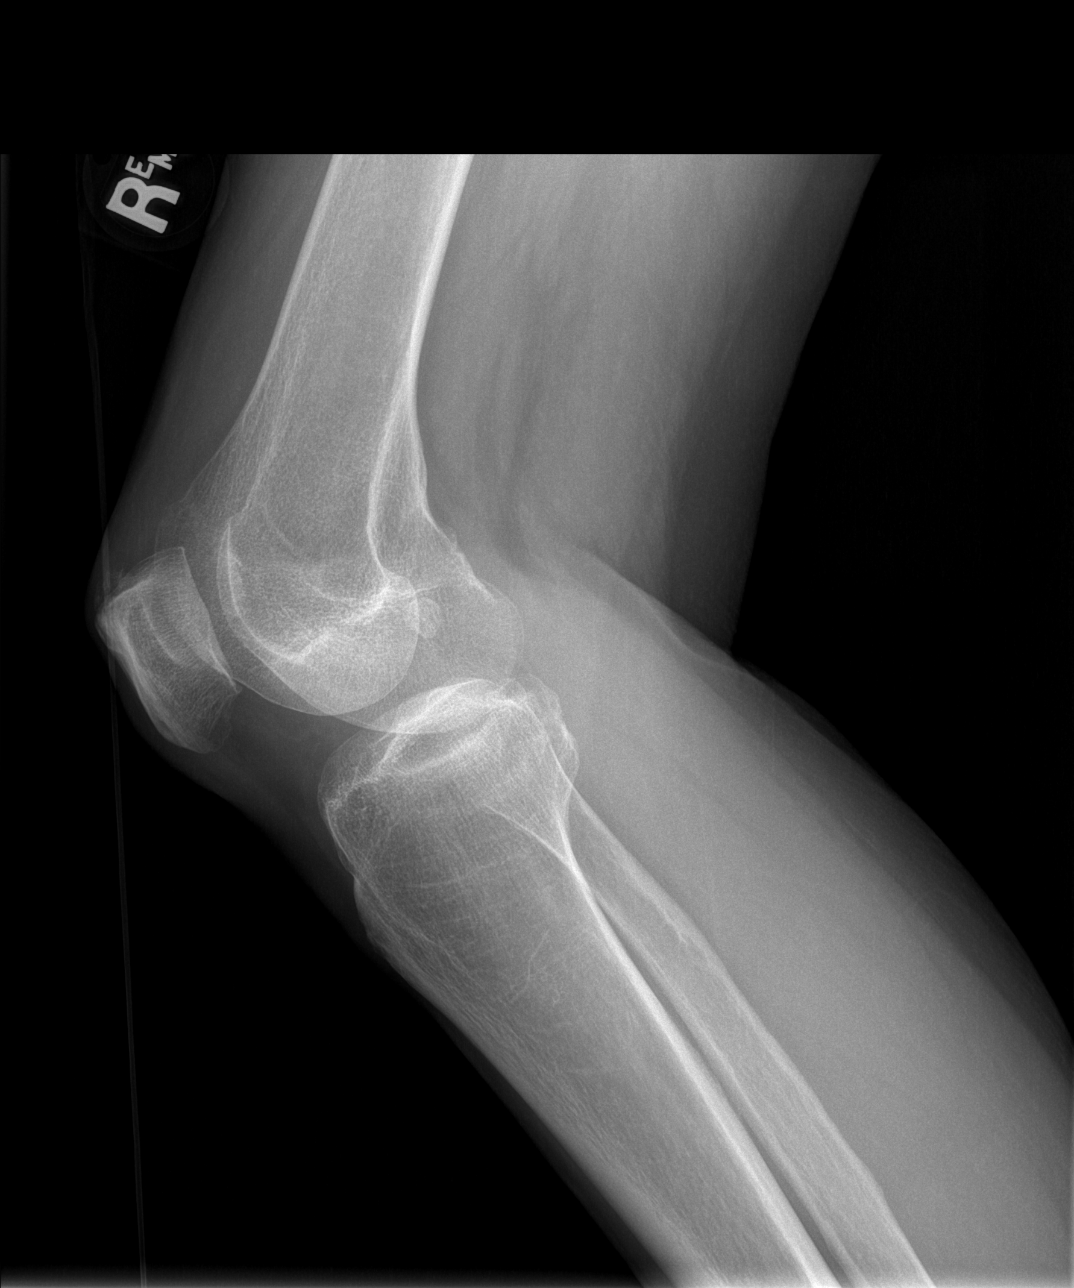

[4 of 4 positions shown; findings below may reference images not displayed]

FINDINGS: Frontal, lateral, and bilateral oblique views were obtained. There
is no apparent fracture or dislocation. No joint effusion. No soft
tissue air evident. There is joint space narrowing medially and in
the patellofemoral joint regions. No erosive change.
IMPRESSION: Areas of osteoarthritic change. No fracture or dislocation. No joint
effusion or soft tissue air.

## 2017-08-13 IMAGING — RF DG WRIST 2V*R*
1 series · 2 of 2 positions shown · non-contrast
Comparison: None.

CLINICAL DATA: ORIF right radial fracture

EXAM:
DG C-ARM 61-120 MIN; RIGHT WRIST - 2 VIEW

[Series 1: run · 2 of 2 slices shown]
[im 1/2]
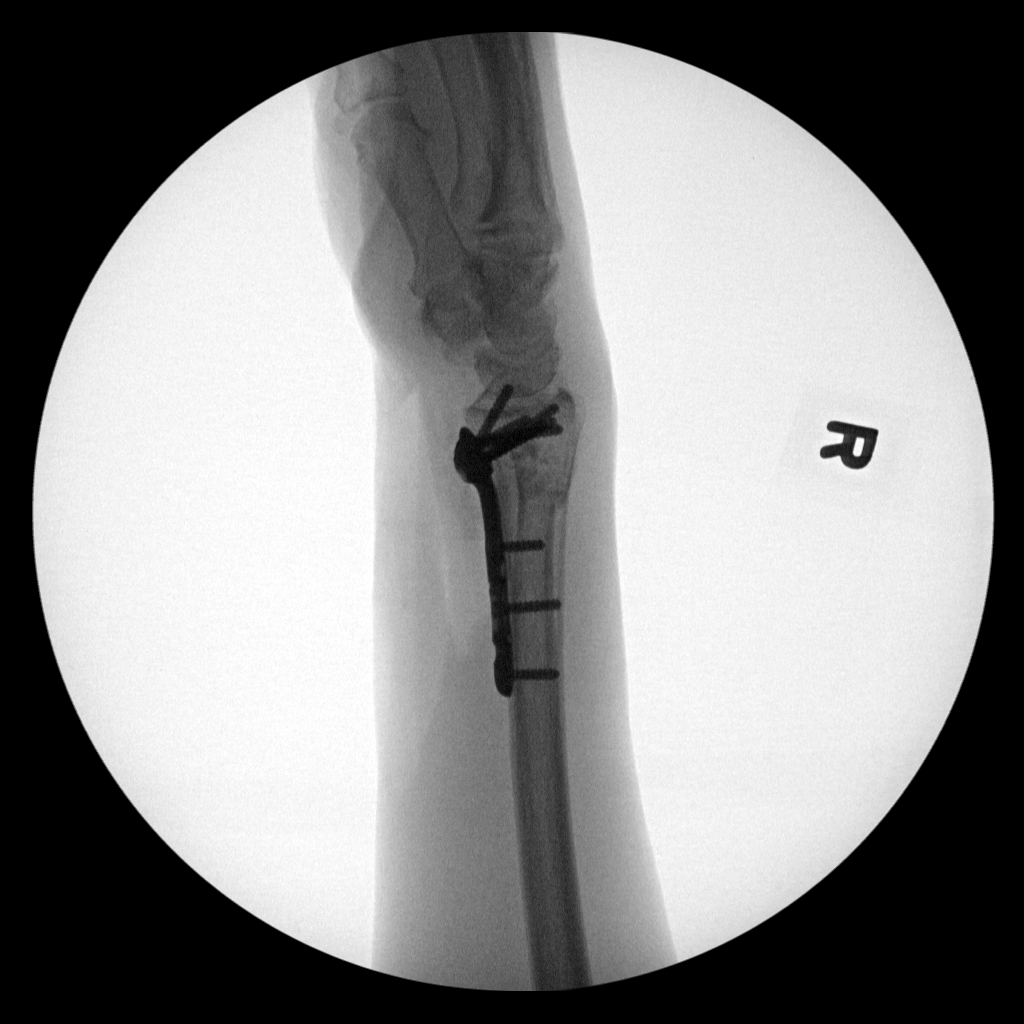
[im 2/2]
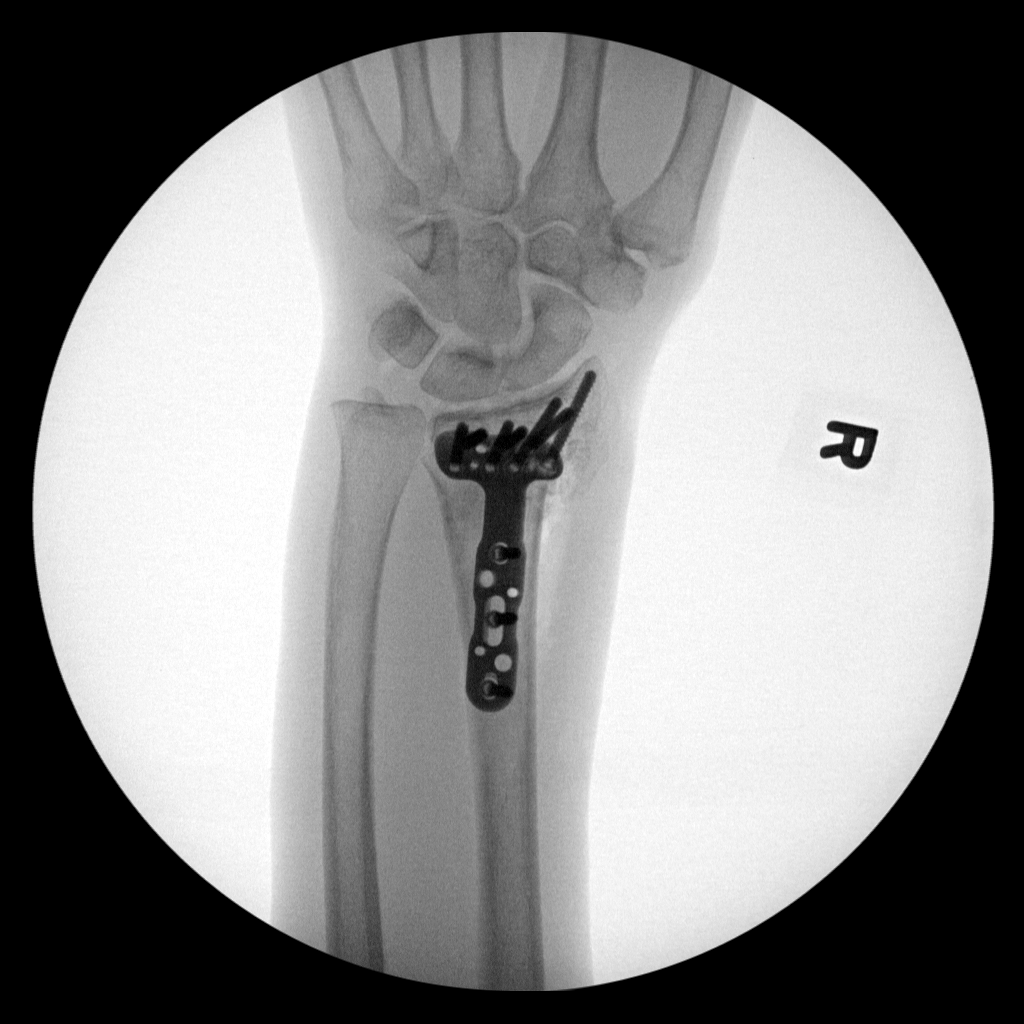

[2 of 2 positions shown; findings below may reference images not displayed]

FLUOROSCOPY TIME:  Radiation Exposure Index (as provided by the
fluoroscopic device): Not available

If the device does not provide the exposure index:

Fluoroscopy Time:  39 seconds

Number of Acquired Images:  2
FINDINGS: Fixation sideplate is noted along the distal radius. The fracture
fragments are in near anatomic alignment. No gross soft tissue
abnormality is seen.
IMPRESSION: ORIF of distal radial fracture.

## 2018-07-03 ENCOUNTER — Other Ambulatory Visit: Payer: Self-pay | Admitting: Internal Medicine

## 2018-07-03 DIAGNOSIS — Z1231 Encounter for screening mammogram for malignant neoplasm of breast: Secondary | ICD-10-CM

## 2018-07-03 DIAGNOSIS — M810 Age-related osteoporosis without current pathological fracture: Secondary | ICD-10-CM

## 2018-10-03 ENCOUNTER — Other Ambulatory Visit: Payer: Self-pay

## 2018-10-03 ENCOUNTER — Ambulatory Visit
Admission: RE | Admit: 2018-10-03 | Discharge: 2018-10-03 | Disposition: A | Discharge status: Home / Self Care | Payer: Medicare Other | Source: Ambulatory Visit | Attending: Internal Medicine | Admitting: Internal Medicine

## 2018-10-03 DIAGNOSIS — M810 Age-related osteoporosis without current pathological fracture: Secondary | ICD-10-CM

## 2018-10-03 DIAGNOSIS — Z1231 Encounter for screening mammogram for malignant neoplasm of breast: Secondary | ICD-10-CM

## 2019-05-11 ENCOUNTER — Ambulatory Visit: Payer: Medicare Other | Attending: Internal Medicine

## 2019-05-11 DIAGNOSIS — Z23 Encounter for immunization: Secondary | ICD-10-CM

## 2019-05-11 NOTE — Progress Notes (Signed)
   Covid-19 Vaccination Clinic  Name:  Renee Riley    MRN: [id number] DOB: 1946/03/19  05/11/2019  [name] was observed post Covid-19 immunization for 15 minutes without incidence. She was provided with Vaccine Information Sheet and instruction to access the V-Safe system.   [name] was instructed to call 911 with any severe reactions post vaccine: Marland Kitchen Difficulty breathing  . Swelling of your face and throat  . A fast heartbeat  . A bad rash all over your body  . Dizziness and weakness    Immunizations Administered    Name Date Dose VIS Date Route   Pfizer COVID-19 Vaccine 05/11/2019  8:31 AM 0.3 mL 03/09/2019 Intramuscular   Manufacturer: [employer], [employer]   Lot: [id number]   NDC: [address]-5

## 2019-06-02 ENCOUNTER — Ambulatory Visit: Payer: Medicare Other | Attending: Internal Medicine

## 2019-06-02 DIAGNOSIS — Z23 Encounter for immunization: Secondary | ICD-10-CM | POA: Insufficient documentation

## 2019-06-02 NOTE — Progress Notes (Signed)
   Covid-19 Vaccination Clinic  Name:  Soliana Kitko    MRN: [id number] DOB: 1945-06-13  06/02/2019  Ms. Micheals was observed post Covid-19 immunization for 15 minutes without incident. She was provided with Vaccine Information Sheet and instruction to access the V-Safe system.   Ms. Jetter was instructed to call 911 with any severe reactions post vaccine: Marland Kitchen Difficulty breathing  . Swelling of face and throat  . A fast heartbeat  . A bad rash all over body  . Dizziness and weakness   Immunizations Administered    Name Date Dose VIS Date Route   Pfizer COVID-19 Vaccine 06/02/2019  2:32 PM 0.3 mL 03/09/2019 Intramuscular   Manufacturer: [employer], [employer]   Lot: [id number]   NDC: [address]-6

## 2019-08-28 ENCOUNTER — Other Ambulatory Visit: Payer: Self-pay | Admitting: Internal Medicine

## 2019-08-28 DIAGNOSIS — Z1231 Encounter for screening mammogram for malignant neoplasm of breast: Secondary | ICD-10-CM

## 2019-10-04 ENCOUNTER — Other Ambulatory Visit: Payer: Self-pay

## 2019-10-04 ENCOUNTER — Ambulatory Visit
Admission: RE | Admit: 2019-10-04 | Discharge: 2019-10-04 | Disposition: A | Discharge status: Home / Self Care | Payer: Medicare Other | Source: Ambulatory Visit | Attending: Internal Medicine | Admitting: Internal Medicine

## 2019-10-04 DIAGNOSIS — Z1231 Encounter for screening mammogram for malignant neoplasm of breast: Secondary | ICD-10-CM

## 2020-01-26 ENCOUNTER — Ambulatory Visit: Payer: Medicare Other | Attending: Internal Medicine

## 2020-01-26 DIAGNOSIS — Z23 Encounter for immunization: Secondary | ICD-10-CM

## 2020-01-26 NOTE — Progress Notes (Signed)
   Covid-19 Vaccination Clinic  Name:  Renee Riley    MRN: [id number] DOB: 20-Jul-1945  01/26/2020  [name] was observed post Covid-19 immunization for 15 minutes without incident. She was provided with Vaccine Information Sheet and instruction to access the V-Safe system.   [name] was instructed to call 911 with any severe reactions post vaccine: Marland Kitchen Difficulty breathing  . Swelling of face and throat  . A fast heartbeat  . A bad rash all over body  . Dizziness and weakness

## 2020-08-22 ENCOUNTER — Other Ambulatory Visit: Payer: Self-pay | Admitting: Internal Medicine

## 2020-08-22 DIAGNOSIS — Z1231 Encounter for screening mammogram for malignant neoplasm of breast: Secondary | ICD-10-CM

## 2020-10-07 ENCOUNTER — Ambulatory Visit: Payer: Medicare Other

## 2020-11-05 ENCOUNTER — Ambulatory Visit
Admission: RE | Admit: 2020-11-05 | Discharge: 2020-11-05 | Disposition: A | Discharge status: Home / Self Care | Payer: Medicare Other | Source: Ambulatory Visit | Attending: Internal Medicine | Admitting: Internal Medicine

## 2020-11-05 ENCOUNTER — Other Ambulatory Visit: Payer: Self-pay

## 2020-11-05 DIAGNOSIS — Z1231 Encounter for screening mammogram for malignant neoplasm of breast: Secondary | ICD-10-CM

## 2020-11-10 ENCOUNTER — Other Ambulatory Visit: Payer: Self-pay | Admitting: Internal Medicine

## 2020-11-10 DIAGNOSIS — R928 Other abnormal and inconclusive findings on diagnostic imaging of breast: Secondary | ICD-10-CM

## 2020-11-21 ENCOUNTER — Ambulatory Visit
Admission: RE | Admit: 2020-11-21 | Discharge: 2020-11-21 | Disposition: A | Discharge status: Home / Self Care | Payer: Medicare Other | Source: Ambulatory Visit | Attending: Internal Medicine | Admitting: Internal Medicine

## 2020-11-21 ENCOUNTER — Ambulatory Visit: Payer: Medicare Other

## 2020-11-21 ENCOUNTER — Other Ambulatory Visit: Payer: Self-pay

## 2020-11-21 DIAGNOSIS — R928 Other abnormal and inconclusive findings on diagnostic imaging of breast: Secondary | ICD-10-CM

## 2021-03-10 ENCOUNTER — Ambulatory Visit (INDEPENDENT_AMBULATORY_CARE_PROVIDER_SITE_OTHER): Payer: Medicare Other | Admitting: Orthopaedic Surgery

## 2021-03-10 ENCOUNTER — Ambulatory Visit (INDEPENDENT_AMBULATORY_CARE_PROVIDER_SITE_OTHER): Payer: Medicare Other

## 2021-03-10 ENCOUNTER — Other Ambulatory Visit: Payer: Self-pay

## 2021-03-10 DIAGNOSIS — S7222XA Displaced subtrochanteric fracture of left femur, initial encounter for closed fracture: Secondary | ICD-10-CM

## 2021-03-10 NOTE — Progress Notes (Signed)
Office Visit Note   Patient: Renee Riley           Date of Birth: 1945/08/22           MRN: [id number] Visit Date: 03/10/2021              Requested by: Debbrah Alar, MD [address] [place],  [place] [address] PCP: Debbrah Alar, MD   Assessment & Plan: Visit Diagnoses:  1. Closed displaced subtrochanteric fracture of left femur, initial encounter (HCC)    Renee Riley is a pleasant 75 year old female who is here today with her husband.  She sustained a mechanical fall a few days after Thanksgiving while in [place] sustaining a subtrochanteric femur fracture.  She was treated operatively with an intramedullary nail and cerclage cable in [place] following the injury.  She did spend 2 weeks in the hospital with the second week in inpatient rehab.  She is now home and [place] and is getting home health physical therapy.  She has been compliant touchdown weightbearing using a walker.  She has minimal pain which is primarily alleviated with Tylenol and occasional oxycodone.  She is on Eliquis twice daily for 35 days for DVT prophylaxis.  Examination left hip reveals a well-healed surgical incision without evidence of infection or cellulitis.  She does have a small nontender seroma.  Calf is soft and nontender.  She does have moderate swelling and ecchymosis to left lower extremity.  No calf pain.  Negative Homans.  She is neurovascular intact distally.  Today, staples were removed and Steri-Strips applied.  I provided her with a prescription for TED hose as I think this will help with the swelling.  She will remain touchdown weightbearing for another 4 weeks.  We will obtain vitamin D and calcium levels today.  Follow-up with [place] in 4 weeks time for repeat evaluation and x-rays of the left femur.  Call with concerns or questions.  Follow-Up Instructions: Return in about 4 weeks (around 04/07/2021).   Orders:  Orders Placed This Encounter  Procedures   XR FEMUR MIN 2  VIEWS LEFT   Calcium   Vitamin D (25 hydroxy)   No orders of the defined types were placed in this encounter.     Procedures: No procedures performed   Clinical Data: No additional findings.   Subjective: Chief Complaint  Patient presents with   Left Leg - Routine Post Op    HPI    Objective: Vital Signs: There were no vitals taken for this visit.   Specialty Comments:  No specialty comments available.  Imaging: No results found.   PMFS History: Patient Active Problem List   Diagnosis Date Noted   Closed fracture of right distal radius and ulna, with routine healing, subsequent encounter 01/22/2016   Past Medical History:  Diagnosis Date   Arthritis    Distal radius fracture, right    Dry eyes    GERD (gastroesophageal reflux disease)    Headache    Hypertension    Osteoporosis    PONV (postoperative nausea and vomiting)     Family History  Problem Relation Age of Onset   Kidney Stones Mother    Osteoporosis Mother    Cancer Father     Past Surgical History:  Procedure Laterality Date   CATARACT EXTRACTION     right eye   DILATION AND CURETTAGE OF UTERUS     FRACTURE SURGERY     Left hip  JOINT REPLACEMENT     right hip   OPEN REDUCTION INTERNAL FIXATION (ORIF) DISTAL RADIAL FRACTURE Right 10/29/2015   Procedure: OPEN REDUCTION INTERNAL FIXATION (ORIF) RIGHT DISTAL RADIUS FRACTURE;  Surgeon: Tarry Kos, MD;  Location: [facility] OR;  Service: Orthopedics;  Laterality: Right;   Social History   Occupational History   Not on file  Tobacco Use   Smoking status: Never   Smokeless tobacco: Never  Substance and Sexual Activity   Alcohol use: Yes    Comment: occasional   Drug use: No   Sexual activity: Not on file

## 2021-03-11 LAB — VITAMIN D 25 HYDROXY (VIT D DEFICIENCY, FRACTURES): Vit D, 25-Hydroxy: 55 ng/mL (ref 30–100)

## 2021-03-11 LAB — CALCIUM: Calcium: 9.6 mg/dL (ref 8.6–10.4)

## 2021-04-07 ENCOUNTER — Ambulatory Visit (INDEPENDENT_AMBULATORY_CARE_PROVIDER_SITE_OTHER): Payer: Medicare Other

## 2021-04-07 ENCOUNTER — Other Ambulatory Visit: Payer: Self-pay

## 2021-04-07 ENCOUNTER — Ambulatory Visit (INDEPENDENT_AMBULATORY_CARE_PROVIDER_SITE_OTHER): Payer: Medicare Other | Admitting: Orthopaedic Surgery

## 2021-04-07 DIAGNOSIS — S7222XA Displaced subtrochanteric fracture of left femur, initial encounter for closed fracture: Secondary | ICD-10-CM

## 2021-04-07 NOTE — Progress Notes (Signed)
Office Visit Note   Patient: Renee Riley           Date of Birth: 1945/07/21           MRN: [id number] Visit Date: 04/07/2021              Requested by: Debbrah Alar, MD [address] [place],  [place] [address] PCP: Debbrah Alar, MD   Assessment & Plan: Visit Diagnoses:  1. Closed displaced subtrochanteric fracture of left femur, initial encounter [facility])     Plan: [name] is now approximately 6 weeks status post IM nail left subtrochanteric fracture.  This was performed in [place] over Thanksgiving.  She follows up today for her 6-week visit.  Overall doing well does not have any real complaints.  She has done home health PT up until this point.  Left hip surgical scars are all fully healed.  Minimal swelling.  No signs of infection.  Decent range of motion without pain.  She is able to put pressure on her left leg without significant hip pain.  The x-rays are demonstrating some early callus formation and signs of fracture healing.  At this point I feel that we can advance her to weight-bear as tolerated with a rolling walker.  I made a referral to outpatient PT.  She can discontinue the Eliquis and resume her baby aspirin.  We will see her back in 6 weeks with two-view x-rays of the left femur.  Follow-Up Instructions: Return in about 6 weeks (around 05/19/2021).   Orders:  Orders Placed This Encounter  Procedures   XR FEMUR MIN 2 VIEWS LEFT   No orders of the defined types were placed in this encounter.     Procedures: No procedures performed   Clinical Data: No additional findings.   Subjective: Chief Complaint  Patient presents with   Left Leg - Follow-up    HPI  Review of Systems   Objective: Vital Signs: There were no vitals taken for this visit.  Physical Exam  Ortho Exam  Specialty Comments:  No specialty comments available.  Imaging: XR FEMUR MIN 2 VIEWS LEFT  Result Date: 04/07/2021 Stable fixation and alignment of  subtrochanteric femur fracture.  The fracture is demonstrating some healing and early callus formation.    PMFS History: Patient Active Problem List   Diagnosis Date Noted   Closed fracture of right distal radius and ulna, with routine healing, subsequent encounter 01/22/2016   Past Medical History:  Diagnosis Date   Arthritis    Distal radius fracture, right    Dry eyes    GERD (gastroesophageal reflux disease)    Headache    Hypertension    Osteoporosis    PONV (postoperative nausea and vomiting)     Family History  Problem Relation Age of Onset   Kidney Stones Mother    Osteoporosis Mother    Cancer Father     Past Surgical History:  Procedure Laterality Date   CATARACT EXTRACTION     right eye   DILATION AND CURETTAGE OF UTERUS     FRACTURE SURGERY     Left hip    JOINT REPLACEMENT     right hip   OPEN REDUCTION INTERNAL FIXATION (ORIF) DISTAL RADIAL FRACTURE Right 10/29/2015   Procedure: OPEN REDUCTION INTERNAL FIXATION (ORIF) RIGHT DISTAL RADIUS FRACTURE;  Surgeon: Tarry Kos, MD;  Location: [facility];  Service: Orthopedics;  Laterality: Right;   Social History   Occupational History  Not on file  Tobacco Use   Smoking status: Never   Smokeless tobacco: Never  Substance and Sexual Activity   Alcohol use: Yes    Comment: occasional   Drug use: No   Sexual activity: Not on file

## 2021-05-19 ENCOUNTER — Ambulatory Visit (INDEPENDENT_AMBULATORY_CARE_PROVIDER_SITE_OTHER): Payer: Medicare Other

## 2021-05-19 ENCOUNTER — Other Ambulatory Visit: Payer: Self-pay

## 2021-05-19 ENCOUNTER — Ambulatory Visit (INDEPENDENT_AMBULATORY_CARE_PROVIDER_SITE_OTHER): Payer: Medicare Other | Admitting: Orthopaedic Surgery

## 2021-05-19 ENCOUNTER — Encounter: Payer: Self-pay | Admitting: Orthopaedic Surgery

## 2021-05-19 DIAGNOSIS — S7222XA Displaced subtrochanteric fracture of left femur, initial encounter for closed fracture: Secondary | ICD-10-CM

## 2021-05-19 NOTE — Progress Notes (Signed)
Office Visit Note   Patient: Renee Riley           Date of Birth: 1945-10-29           MRN: [id number] Visit Date: 05/19/2021              Requested by: Hughie Closs, [address] [place],  [place] [id number] PCP: Haywood Pao, MD   Assessment & Plan: Visit Diagnoses:  1. Closed displaced subtrochanteric fracture of left femur, initial encounter [facility])     Plan: Torey returns today 12 weeks status post IM nail left subtrochanteric femur fracture.  She is doing physical therapy twice a week and using a cane.  She reports soreness and discomfort mainly after sitting and immobilization.  Examination of left hip shows a slight limp with start up.  No significant pain with range of motion.  The x-rays demonstrate that there is not been significant improvement in fracture healing.  I am concerned that this may be going on to a delayed union therefore we will going to get a CT scan of the left femur to evaluate for this.  She did have vitamin D and calcium levels a couple months ago which were normal.  I will be in touch with the patient regarding the results of the CT scan.  Follow-Up Instructions: No follow-ups on file.   Orders:  Orders Placed This Encounter  Procedures   XR FEMUR MIN 2 VIEWS LEFT   No orders of the defined types were placed in this encounter.     Procedures: No procedures performed   Clinical Data: No additional findings.   Subjective: Chief Complaint  Patient presents with   Left Hip - Follow-up    IM nailing of subtrochanteric fracture    HPI  Review of Systems   Objective: Vital Signs: There were no vitals taken for this visit.  Physical Exam  Ortho Exam  Specialty Comments:  No specialty comments available.  Imaging: XR FEMUR MIN 2 VIEWS LEFT  Result Date: 05/19/2021 Stable alignment and fixation of the fracture.  There is not a significant improvement in fracture consolidation healing.    PMFS  History: Patient Active Problem List   Diagnosis Date Noted   Closed fracture of right distal radius and ulna, with routine healing, subsequent encounter 01/22/2016   Past Medical History:  Diagnosis Date   Arthritis    Distal radius fracture, right    Dry eyes    GERD (gastroesophageal reflux disease)    Headache    Hypertension    Osteoporosis    PONV (postoperative nausea and vomiting)     Family History  Problem Relation Age of Onset   Kidney Stones Mother    Osteoporosis Mother    Cancer Father     Past Surgical History:  Procedure Laterality Date   CATARACT EXTRACTION     right eye   DILATION AND CURETTAGE OF UTERUS     FRACTURE SURGERY     Left hip    JOINT REPLACEMENT     right hip   OPEN REDUCTION INTERNAL FIXATION (ORIF) DISTAL RADIAL FRACTURE Right 10/29/2015   Procedure: OPEN REDUCTION INTERNAL FIXATION (ORIF) RIGHT DISTAL RADIUS FRACTURE;  Surgeon: Leandrew Koyanagi, MD;  Location: [facility];  Service: Orthopedics;  Laterality: Right;   Social History   Occupational History   Not on file  Tobacco Use   Smoking status: Never   Smokeless tobacco: Never  Substance and Sexual  Activity   Alcohol use: Yes    Comment: occasional   Drug use: No   Sexual activity: Not on file

## 2021-05-20 ENCOUNTER — Other Ambulatory Visit: Payer: Self-pay

## 2021-05-20 DIAGNOSIS — S7222XA Displaced subtrochanteric fracture of left femur, initial encounter for closed fracture: Secondary | ICD-10-CM

## 2021-06-10 ENCOUNTER — Ambulatory Visit
Admission: RE | Admit: 2021-06-10 | Discharge: 2021-06-10 | Disposition: A | Discharge status: Home / Self Care | Payer: Medicare Other | Source: Ambulatory Visit | Attending: Orthopaedic Surgery | Admitting: Orthopaedic Surgery

## 2021-06-10 ENCOUNTER — Other Ambulatory Visit: Payer: Self-pay

## 2021-06-10 DIAGNOSIS — S7222XA Displaced subtrochanteric fracture of left femur, initial encounter for closed fracture: Secondary | ICD-10-CM

## 2021-06-11 NOTE — Progress Notes (Signed)
Please order bone stimulator for patient.

## 2021-08-12 ENCOUNTER — Ambulatory Visit (INDEPENDENT_AMBULATORY_CARE_PROVIDER_SITE_OTHER): Payer: Medicare Other | Admitting: Orthopaedic Surgery

## 2021-08-12 ENCOUNTER — Ambulatory Visit (INDEPENDENT_AMBULATORY_CARE_PROVIDER_SITE_OTHER): Payer: Medicare Other

## 2021-08-12 DIAGNOSIS — S7222XA Displaced subtrochanteric fracture of left femur, initial encounter for closed fracture: Secondary | ICD-10-CM

## 2021-08-12 NOTE — Progress Notes (Signed)
? ?Office Visit Note ?  ?Patient: Renee Riley           ?Date of Birth: January 05, 1946           ?MRN: [id number] ?Visit Date: 08/12/2021 ?             ?Requested by: Gaspar Garbe, MD ?[address] ?[place],  [place] [address] ?PCP: Gaspar Garbe, MD ? ? ?Assessment & Plan: ?Visit Diagnoses:  ?1. Closed displaced subtrochanteric fracture of left femur, initial encounter (HCC)   ? ? ?Plan:  ? ?Renee Riley returns today for follow-up of her left subtrochanteric femur fracture.  She has been using the bone stimulator every day.  Overall she is doing better and is having less difficulty with walking and activities.  Feels like her leg is a little malrotated. ? ?Examination of the left lower extremity is stable. ? ?Overall I feel that the fracture is healing slowly.  She is improving clinically as well which is important.  I explained that there may be some malrotation due to how the fracture was repaired because of the nature of the fracture pattern it can be difficult to assess rotation.  She understands this.  For now we will continue with the bone stimulator and activity as tolerated.  Recheck in 2 months with two-view x-rays of the left femur. ? ?Follow-Up Instructions: Return in about 2 months (around 10/12/2021).  ? ?Orders:  ?Orders Placed This Encounter  ?Procedures  ? XR FEMUR MIN 2 VIEWS LEFT  ? ?No orders of the defined types were placed in this encounter. ? ? ? ? Procedures: ?No procedures performed ? ? ?Clinical Data: ?No additional findings. ? ? ?Subjective: ?Chief Complaint  ?Patient presents with  ? Left Leg - Follow-up  ? ? ?HPI ? ?Review of Systems ? ? ?Objective: ?Vital Signs: There were no vitals taken for this visit. ? ?Physical Exam ? ?Ortho Exam ? ?Specialty Comments:  ?No specialty comments available. ? ?Imaging: ?XR FEMUR MIN 2 VIEWS LEFT ? ?Result Date: 08/12/2021 ?Stable fixation of subtrochanteric fracture with continued formation of bridging callus.  ? ? ?PMFS History: ?Patient Active Problem  List  ? Diagnosis Date Noted  ? Closed fracture of right distal radius and ulna, with routine healing, subsequent encounter 01/22/2016  ? ?Past Medical History:  ?Diagnosis Date  ? Arthritis   ? Distal radius fracture, right   ? Dry eyes   ? GERD (gastroesophageal reflux disease)   ? Headache   ? Hypertension   ? Osteoporosis   ? PONV (postoperative nausea and vomiting)   ?  ?Family History  ?Problem Relation Age of Onset  ? Kidney Stones Mother   ? Osteoporosis Mother   ? Cancer Father   ?  ?Past Surgical History:  ?Procedure Laterality Date  ? CATARACT EXTRACTION    ? right eye  ? DILATION AND CURETTAGE OF UTERUS    ? FRACTURE SURGERY    ? Left hip   ? JOINT REPLACEMENT    ? right hip  ? OPEN REDUCTION INTERNAL FIXATION (ORIF) DISTAL RADIAL FRACTURE Right 10/29/2015  ? Procedure: OPEN REDUCTION INTERNAL FIXATION (ORIF) RIGHT DISTAL RADIUS FRACTURE;  Surgeon: Tarry Kos, MD;  Location: [facility];  Service: Orthopedics;  Laterality: Right;  ? ?Social History  ? ?Occupational History  ? Not on file  ?Tobacco Use  ? Smoking status: Never  ? Smokeless tobacco: Never  ?Substance and Sexual Activity  ? Alcohol use: Yes  ?  Comment: occasional  ?  Drug use: No  ? Sexual activity: Not on file  ? ? ? ? ? ? ?

## 2021-10-13 ENCOUNTER — Ambulatory Visit: Payer: Medicare Other | Admitting: Orthopaedic Surgery

## 2021-10-14 ENCOUNTER — Other Ambulatory Visit: Payer: Self-pay | Admitting: Internal Medicine

## 2021-10-14 DIAGNOSIS — Z1231 Encounter for screening mammogram for malignant neoplasm of breast: Secondary | ICD-10-CM

## 2021-10-20 ENCOUNTER — Ambulatory Visit (INDEPENDENT_AMBULATORY_CARE_PROVIDER_SITE_OTHER): Payer: Medicare Other

## 2021-10-20 ENCOUNTER — Ambulatory Visit (INDEPENDENT_AMBULATORY_CARE_PROVIDER_SITE_OTHER): Payer: Medicare Other | Admitting: Orthopaedic Surgery

## 2021-10-20 DIAGNOSIS — S7222XA Displaced subtrochanteric fracture of left femur, initial encounter for closed fracture: Secondary | ICD-10-CM

## 2021-10-20 NOTE — Progress Notes (Signed)
   Post-Op Visit Note   Patient: Renee Riley           Date of Birth: 1945/11/07           MRN: [id number] Visit Date: 10/20/2021 PCP: Gaspar Garbe, MD   Assessment & Plan:  Chief Complaint:  Chief Complaint  Patient presents with   Left Leg - Routine Post Op   Visit Diagnoses:  1. Closed displaced subtrochanteric fracture of left femur, initial encounter [facility])     Plan: [name] is approximately 8 months status post IM nailing of a left subtrochanteric femur fracture that was done in [place].  She comes in for regularly scheduled follow-up.  Walks every day for exercise.  Has no trouble with ADLs.  Has been using the bone stimulator daily.  No real pain to report.  Examination of the left hip is unremarkable.  Overall the x-rays are demonstrating that the fracture is healing.  I see a lot of fracture consolidation especially on the medial side.  Clinically speaking she is making progress and certainly not getting any worse.  She will continue to engage in activity as tolerated and use bone stimulator is much as she can.  Recheck in 4 months with two-view x-rays of the left femur.  If she is doing well at that time anticipate releasing her.  Follow-Up Instructions: Return in about 4 months (around 02/20/2022).   Orders:  Orders Placed This Encounter  Procedures   XR FEMUR MIN 2 VIEWS LEFT   No orders of the defined types were placed in this encounter.   Imaging: XR FEMUR MIN 2 VIEWS LEFT  Result Date: 10/20/2021 X-rays demonstrate continued consolidation to the fracture site.  No hardware complications.  Bone-on-bone joint space narrowing of the hip.   PMFS History: Patient Active Problem List   Diagnosis Date Noted   Closed fracture of right distal radius and ulna, with routine healing, subsequent encounter 01/22/2016   Past Medical History:  Diagnosis Date   Arthritis    Distal radius fracture, right    Dry eyes    GERD (gastroesophageal reflux disease)     Headache    Hypertension    Osteoporosis    PONV (postoperative nausea and vomiting)     Family History  Problem Relation Age of Onset   Kidney Stones Mother    Osteoporosis Mother    Cancer Father     Past Surgical History:  Procedure Laterality Date   CATARACT EXTRACTION     right eye   DILATION AND CURETTAGE OF UTERUS     FRACTURE SURGERY     Left hip    JOINT REPLACEMENT     right hip   OPEN REDUCTION INTERNAL FIXATION (ORIF) DISTAL RADIAL FRACTURE Right 10/29/2015   Procedure: OPEN REDUCTION INTERNAL FIXATION (ORIF) RIGHT DISTAL RADIUS FRACTURE;  Surgeon: Tarry Kos, MD;  Location: [facility];  Service: Orthopedics;  Laterality: Right;   Social History   Occupational History   Not on file  Tobacco Use   Smoking status: Never   Smokeless tobacco: Never  Substance and Sexual Activity   Alcohol use: Yes    Comment: occasional   Drug use: No   Sexual activity: Not on file

## 2021-11-23 ENCOUNTER — Ambulatory Visit: Payer: Medicare Other

## 2021-12-11 ENCOUNTER — Ambulatory Visit
Admission: RE | Admit: 2021-12-11 | Discharge: 2021-12-11 | Disposition: A | Discharge status: Home / Self Care | Payer: Medicare Other | Source: Ambulatory Visit | Attending: Internal Medicine | Admitting: Internal Medicine

## 2021-12-11 DIAGNOSIS — Z1231 Encounter for screening mammogram for malignant neoplasm of breast: Secondary | ICD-10-CM

## 2022-01-20 ENCOUNTER — Ambulatory Visit (AMBULATORY_SURGERY_CENTER): Payer: Self-pay | Admitting: *Deleted

## 2022-01-20 VITALS — Ht 66.0 in | Wt 118.6 lb

## 2022-01-20 DIAGNOSIS — Z1211 Encounter for screening for malignant neoplasm of colon: Secondary | ICD-10-CM

## 2022-01-20 MED ORDER — NA SULFATE-K SULFATE-MG SULF 17.5-3.13-1.6 GM/177ML PO SOLN: 1.0000 | Freq: Once | ORAL | 0 refills | Status: AC

## 2022-01-20 NOTE — Progress Notes (Signed)

## 2022-01-22 ENCOUNTER — Telehealth: Payer: Self-pay | Admitting: Gastroenterology

## 2022-01-22 NOTE — Telephone Encounter (Signed)
Message to pt's voicemail: ok to take Tylenol up until the day of procedure (last dose before NPO time 3 hours before procedure )

## 2022-01-22 NOTE — Telephone Encounter (Signed)
Inbound call from patient calling to see if she is able to take tylenol up until her procedure. Please advise.  Thank you

## 2022-01-25 ENCOUNTER — Ambulatory Visit (AMBULATORY_SURGERY_CENTER): Payer: Medicare Other | Admitting: Gastroenterology

## 2022-01-25 ENCOUNTER — Encounter: Payer: Self-pay | Admitting: Gastroenterology

## 2022-01-25 VITALS — BP 104/68 | HR 79 | Temp 96.8°F | Resp 12 | Ht 66.0 in | Wt 118.6 lb

## 2022-01-25 DIAGNOSIS — Z1211 Encounter for screening for malignant neoplasm of colon: Secondary | ICD-10-CM | POA: Diagnosis not present

## 2022-01-25 HISTORY — PX: COLONOSCOPY: SHX174

## 2022-01-25 MED ORDER — SODIUM CHLORIDE 0.9 % IV SOLN: 500.0000 mL | Freq: Once | INTRAVENOUS | Status: DC

## 2022-01-25 NOTE — Patient Instructions (Signed)
YOU HAD AN ENDOSCOPIC PROCEDURE TODAY AT THE [facility]:   Refer to the procedure report that was given to you for any specific questions about what was found during the examination.  If the procedure report does not answer your questions, please call your gastroenterologist to clarify.  If you requested that your care partner not be given the details of your procedure findings, then the procedure report has been included in a sealed envelope for you to review at your convenience later.  YOU SHOULD EXPECT: Some feelings of bloating in the abdomen. Passage of more gas than usual.  Walking can help get rid of the air that was put into your GI tract during the procedure and reduce the bloating. If you had a lower endoscopy (such as a colonoscopy or flexible sigmoidoscopy) you may notice spotting of blood in your stool or on the toilet paper. If you underwent a bowel prep for your procedure, you may not have a normal bowel movement for a few days.  Please Note:  You might notice some irritation and congestion in your nose or some drainage.  This is from the oxygen used during your procedure.  There is no need for concern and it should clear up in a day or so.  SYMPTOMS TO REPORT IMMEDIATELY:  Following lower endoscopy (colonoscopy or flexible sigmoidoscopy):  Excessive amounts of blood in the stool  Significant tenderness or worsening of abdominal pains  Swelling of the abdomen that is new, acute  Fever of 100F or higher  For urgent or emergent issues, a gastroenterologist can be reached at any hour by calling [phone]. Do not use MyChart messaging for urgent concerns.    DIET:  We do recommend a small meal at first, but then you may proceed to your regular diet.  Drink plenty of fluids but you should avoid alcoholic beverages for 24 hours.  ACTIVITY:  You should plan to take it easy for the rest of today and you should NOT DRIVE or use heavy machinery until tomorrow (because of  the sedation medicines used during the test).    FOLLOW UP: Our staff will call the number listed on your records the next business day following your procedure.  We will call around 7:15- 8:00 am to check on you and address any questions or concerns that you may have regarding the information given to you following your procedure. If we do not reach you, we will leave a message.     If any biopsies were taken you will be contacted by phone or by letter within the next 1-3 weeks.  Please call us at [phone] if you have not heard about the biopsies in 3 weeks.    SIGNATURES/CONFIDENTIALITY: You and/or your care partner have signed paperwork which will be entered into your electronic medical record.  These signatures attest to the fact that that the information above on your After Visit Summary has been reviewed and is understood.  Full responsibility of the confidentiality of this discharge information lies with you and/or your care-partner.  

## 2022-01-25 NOTE — Progress Notes (Signed)
To pacu, VSS. Report to [email or web address] 

## 2022-01-25 NOTE — Op Note (Signed)
[facility] Patient Name: Renee Riley Procedure Date: 01/25/2022 7:56 AM MRN: [id number] Endoscopist: [place]. Loletha Carrow , MD, [id number] Age: 76 Referring MD:  Date of Birth: 05/03/1945 Gender: Female Account #: [id number] Procedure:                Colonoscopy Indications:              Screening for colorectal malignant neoplasm                           No polyps last colonoscopy 10 years ago (outside                            clinic) Medicines:                Monitored Anesthesia Care Procedure:                Pre-Anesthesia Assessment:                           - Prior to the procedure, a History and Physical                            was performed, and patient medications and                            allergies were reviewed. The patient's tolerance of                            previous anesthesia was also reviewed. The risks                            and benefits of the procedure and the sedation                            options and risks were discussed with the patient.                            All questions were answered, and informed consent                            was obtained. Prior Anticoagulants: The patient has                            taken no anticoagulant or antiplatelet agents. ASA                            Grade Assessment: II - A patient with mild systemic                            disease. After reviewing the risks and benefits,                            the patient was deemed in satisfactory condition to  undergo the procedure.                           After obtaining informed consent, the colonoscope                            was passed under direct vision. Throughout the                            procedure, the patient's blood pressure, pulse, and                            oxygen saturations were monitored continuously. The                            Olympus #[id number] was introduced through the anus                             and advanced to the the cecum, identified by                            appendiceal orifice and ileocecal valve. The                            Olympus [id number]-[id number] ([id number]) Colonoscope was                            introduced through the and advanced to the. The                            colonoscopy was somewhat difficult due to a                            tortuous colon. Successful completion of the                            procedure was aided by changing the patient to a                            supine position, using manual pressure and                            straightening and shortening the scope to obtain                            bowel loop reduction. The patient tolerated the                            procedure well. The quality of the bowel                            preparation was excellent. The ileocecal valve,  appendiceal orifice, and rectum were photographed.                            The bowel preparation used was SUPREP. Scope In: 8:17:10 AM Scope Out: 8:34:38 AM Scope Withdrawal Time: 0 hours 9 minutes 50 seconds  Total Procedure Duration: 0 hours 17 minutes 28 seconds  Findings:                 The perianal and digital rectal examinations were                            normal.                           Repeat examination of right colon under NBI                            performed.                           Diverticula were found in the left colon.                           Internal hemorrhoids were found.                           The exam was otherwise without abnormality on                            direct and retroflexion views. Complications:            No immediate complications. Estimated Blood Loss:     Estimated blood loss: none. Impression:               - Diverticulosis in the left colon.                           - Internal hemorrhoids.                           - The examination was otherwise  normal on direct                            and retroflexion views.                           - No specimens collected. Recommendation:           - Patient has a contact number available for                            emergencies. The signs and symptoms of potential                            delayed complications were discussed with the                            patient. Return to normal activities tomorrow.  Written discharge instructions were provided to the                            patient.                           - Resume previous diet.                           - Continue present medications.                           - No repeat screening colonoscopy recommended. Dominyck Reser L. Loletha Carrow, MD 01/25/2022 8:38:16 AM This report has been signed electronically.

## 2022-01-25 NOTE — Progress Notes (Signed)
Pt's states no medical or surgical changes since previsit or office visit. 

## 2022-01-25 NOTE — Progress Notes (Signed)
History and Physical:  This patient presents for endoscopic testing for: Encounter Diagnosis  Name Primary?   Special screening for malignant neoplasms, colon Yes    Reports no polyps on last colonoscopy > 10 yrs ago in [place], [place] Patient denies chronic abdominal pain, rectal bleeding, constipation or diarrhea.   Patient is otherwise without complaints or active issues today.   Past Medical History: Past Medical History:  Diagnosis Date   Arthritis    Blood transfusion without reported diagnosis    Cataract    RIGHT EYE,REMOVED,SMALL ONE LEFT EYE UPDATED 01/20/22   Distal radius fracture, right    Dry eyes    Headache    Hypertension    Osteoporosis    PONV (postoperative nausea and vomiting)      Past Surgical History: Past Surgical History:  Procedure Laterality Date   CATARACT EXTRACTION     right eye   COLONOSCOPY  01/25/2022   DILATION AND CURETTAGE OF UTERUS     FRACTURE SURGERY     Left hip ,2007   HIP SURGERY Left    FEMUR FX [id number]   JOINT REPLACEMENT     right hip 2012   OPEN REDUCTION INTERNAL FIXATION (ORIF) DISTAL RADIAL FRACTURE Right 10/29/2015   Procedure: OPEN REDUCTION INTERNAL FIXATION (ORIF) RIGHT DISTAL RADIUS FRACTURE;  Surgeon: Leandrew Koyanagi, MD;  Location: [facility];  Service: Orthopedics;  Laterality: Right;   WRIST FRACTURE SURGERY Right    2017    Allergies: No Known Allergies  Outpatient Meds: Current Outpatient Medications  Medication Sig Dispense Refill   acetaminophen (TYLENOL) 500 MG tablet Take 500 mg by mouth every 6 (six) hours as needed.     amLODipine (NORVASC) 10 MG tablet Take 10 mg by mouth daily.     Ascorbic Acid (VITAMIN C) 500 MG CHEW Chew 1 tablet (500 mg total) by mouth 2 (two) times daily. 84 tablet 0   aspirin 81 MG tablet Take 81 mg by mouth daily.     cholecalciferol (VITAMIN D) 1000 units tablet Take 1,000 Units by mouth daily.     cycloSPORINE (RESTASIS) 0.05 % ophthalmic emulsion 1 drop into affected eye  Ophthalmic Twice a day     ibuprofen (ADVIL) 400 MG tablet Take 400 mg by mouth every 6 (six) hours as needed.     Multiple Vitamin (MULTIVITAMIN WITH MINERALS) TABS tablet Take 1 tablet by mouth daily.     Current Facility-Administered Medications  Medication Dose Route Frequency Provider Last Rate Last Admin   0.9 %  sodium chloride infusion  500 mL Intravenous Once Danis, Estill Cotta III, MD          ___________________________________________________________________ Objective   Exam:  BP 122/76   Pulse 80   Temp (!) 96.8 F (36 C) (Temporal)   Resp (!) 8   Ht 5\' 6"  (1.676 m)   Wt 118 lb 9.6 oz (53.8 kg)   SpO2 99%   BMI 19.14 kg/m   CV: regular , S1/S2 Resp: clear to auscultation bilaterally, normal RR and effort noted GI: soft, no tenderness, with active bowel sounds.   Assessment: Encounter Diagnosis  Name Primary?   Special screening for malignant neoplasms, colon Yes     Plan: Colonoscopy  The benefits and risks of the planned procedure were described in detail with the patient or (when appropriate) their health care proxy.  Risks were outlined as including, but not limited to, bleeding, infection, perforation, adverse medication reaction leading to cardiac or pulmonary decompensation,  pancreatitis (if ERCP).  The limitation of incomplete mucosal visualization was also discussed.  No guarantees or warranties were given.    The patient is appropriate for an endoscopic procedure in the ambulatory setting.   - Wilfrid Lund, MD

## 2022-01-26 ENCOUNTER — Telehealth: Payer: Self-pay

## 2022-01-26 NOTE — Telephone Encounter (Signed)
  Follow up Call-     01/25/2022    7:24 AM  Call back number  Post procedure Call Back phone  # [phone]  Permission to leave phone message Yes     Patient questions:  Do you have a fever, pain , or abdominal swelling? No. Pain Score  0 *  Have you tolerated food without any problems? Yes.    Have you been able to return to your normal activities? Yes.    Do you have any questions about your discharge instructions: Diet   No. Medications  No. Follow up visit  No.  Do you have questions or concerns about your Care? No.  Actions: * If pain score is 4 or above: No action needed, pain <4.

## 2022-02-23 ENCOUNTER — Encounter: Payer: Self-pay | Admitting: Orthopaedic Surgery

## 2022-02-23 ENCOUNTER — Ambulatory Visit (INDEPENDENT_AMBULATORY_CARE_PROVIDER_SITE_OTHER): Payer: Medicare Other | Admitting: Orthopaedic Surgery

## 2022-02-23 ENCOUNTER — Ambulatory Visit (INDEPENDENT_AMBULATORY_CARE_PROVIDER_SITE_OTHER): Payer: Medicare Other

## 2022-02-23 DIAGNOSIS — M1612 Unilateral primary osteoarthritis, left hip: Secondary | ICD-10-CM

## 2022-02-23 DIAGNOSIS — S7222XA Displaced subtrochanteric fracture of left femur, initial encounter for closed fracture: Secondary | ICD-10-CM | POA: Diagnosis not present

## 2022-02-23 NOTE — Progress Notes (Signed)
Office Visit Note   Patient: Renee Riley           Date of Birth: September 12, 1945           MRN: [id number] Visit Date: 02/23/2022              Requested by: Gaspar Garbe, MD [address] [place],  [place] [address] PCP: Gaspar Garbe, MD   Assessment & Plan: Visit Diagnoses:  1. Closed displaced subtrochanteric fracture of left femur, initial encounter (HCC)   2. Primary osteoarthritis of left hip     Plan: Based on findings I feel that her hip OA is becoming more symptomatic.  She does have bone-on-bone there.  I also question she may have some symptoms of lumbar radiculopathy.  She can try cortisone injection in the hip joint or try steroid Dosepak.  She will let us know if she is interested.  From a fracture standpoint everything looks to be healed and that clinically she is doing well so she does not need to follow-up with me for this anymore.  We also had a discussion on the surgery that is involved in conversion to hip replacement.  Follow-Up Instructions: No follow-ups on file.   Orders:  Orders Placed This Encounter  Procedures   XR FEMUR MIN 2 VIEWS LEFT   No orders of the defined types were placed in this encounter.     Procedures: No procedures performed   Clinical Data: No additional findings.   Subjective: Chief Complaint  Patient presents with   Left Leg - Follow-up    S/p IM nailing of subtroch femur fracture    HPI Renee Riley returns today for follow-up of her subtrochanteric fracture.  She is a year from the injury and the surgery.  Denies any worsening in her symptoms. Review of Systems  Constitutional: Negative.   HENT: Negative.    Eyes: Negative.   Respiratory: Negative.    Cardiovascular: Negative.   Endocrine: Negative.   Musculoskeletal: Negative.   Neurological: Negative.   Hematological: Negative.   Psychiatric/Behavioral: Negative.    All other systems reviewed and are negative.    Objective: Vital Signs: There were no  vitals taken for this visit.  Physical Exam Vitals and nursing note reviewed.  Constitutional:      Appearance: She is well-developed.  HENT:     Head: Normocephalic and atraumatic.  Pulmonary:     Effort: Pulmonary effort is normal.  Abdominal:     Palpations: Abdomen is soft.  Musculoskeletal:     Cervical back: Neck supple.  Skin:    General: Skin is warm.     Capillary Refill: Capillary refill takes less than 2 seconds.  Neurological:     Mental Status: She is alert and oriented to person, place, and time.  Psychiatric:        Behavior: Behavior normal.        Thought Content: Thought content normal.        Judgment: Judgment normal.     Ortho Exam Examination of the left hip is unchanged. Specialty Comments:  No specialty comments available.  Imaging: XR FEMUR MIN 2 VIEWS LEFT  Result Date: 02/23/2022 X-rays demonstrate stable fixation and fracture consolidation.  There is no hardware complications.  Hip joint is bone-on-bone.    PMFS History: Patient Active Problem List   Diagnosis Date Noted   Closed fracture of right distal radius and ulna, with routine healing, subsequent encounter 01/22/2016   Past Medical History:  Diagnosis Date   Arthritis    Blood transfusion without reported diagnosis    Cataract    RIGHT EYE,REMOVED,SMALL ONE LEFT EYE UPDATED 01/20/22   Distal radius fracture, right    Dry eyes    Headache    Hypertension    Osteoporosis    PONV (postoperative nausea and vomiting)     Family History  Problem Relation Age of Onset   Kidney Stones Mother    Osteoporosis Mother    Cancer Father    Colon cancer Neg Hx    Colon polyps Neg Hx    Crohn's disease Neg Hx    Esophageal cancer Neg Hx    Rectal cancer Neg Hx    Stomach cancer Neg Hx    Ulcerative colitis Neg Hx     Past Surgical History:  Procedure Laterality Date   CATARACT EXTRACTION     right eye   COLONOSCOPY  01/25/2022   DILATION AND CURETTAGE OF UTERUS     FRACTURE  SURGERY     Left hip ,2007   HIP SURGERY Left    FEMUR FX [id number]   JOINT REPLACEMENT     right hip 2012   OPEN REDUCTION INTERNAL FIXATION (ORIF) DISTAL RADIAL FRACTURE Right 10/29/2015   Procedure: OPEN REDUCTION INTERNAL FIXATION (ORIF) RIGHT DISTAL RADIUS FRACTURE;  Surgeon: Leandrew Koyanagi, MD;  Location: [facility];  Service: Orthopedics;  Laterality: Right;   WRIST FRACTURE SURGERY Right    2017   Social History   Occupational History   Not on file  Tobacco Use   Smoking status: Never    Passive exposure: Past (YOUNGER DAYS)   Smokeless tobacco: Never  Vaping Use   Vaping Use: Never used  Substance and Sexual Activity   Alcohol use: Yes    Comment: occasional   Drug use: No   Sexual activity: Not Currently

## 2022-11-08 ENCOUNTER — Other Ambulatory Visit: Payer: Self-pay | Admitting: Internal Medicine

## 2022-11-08 DIAGNOSIS — Z0000 Encounter for general adult medical examination without abnormal findings: Secondary | ICD-10-CM

## 2022-12-14 ENCOUNTER — Ambulatory Visit
Admission: RE | Admit: 2022-12-14 | Discharge: 2022-12-14 | Disposition: A | Discharge status: Home / Self Care | Payer: Medicare Other | Source: Ambulatory Visit | Attending: Internal Medicine | Admitting: Internal Medicine

## 2022-12-14 DIAGNOSIS — Z0000 Encounter for general adult medical examination without abnormal findings: Secondary | ICD-10-CM

## 2023-11-11 ENCOUNTER — Other Ambulatory Visit: Payer: Self-pay | Admitting: Internal Medicine

## 2023-11-11 DIAGNOSIS — Z1231 Encounter for screening mammogram for malignant neoplasm of breast: Secondary | ICD-10-CM

## 2023-12-16 ENCOUNTER — Ambulatory Visit
Admission: RE | Admit: 2023-12-16 | Discharge: 2023-12-16 | Disposition: A | Discharge status: Home / Self Care | Source: Ambulatory Visit | Attending: Internal Medicine | Admitting: Internal Medicine

## 2023-12-16 DIAGNOSIS — Z1231 Encounter for screening mammogram for malignant neoplasm of breast: Secondary | ICD-10-CM
